# Patient Record
Sex: Male | Born: 1960 | Race: White | Hispanic: No | Marital: Single | Smoking: Never smoker
Health system: Southern US, Community
[De-identification: ages and names within clinical notes are randomized; demographics above are authoritative.]

## PROBLEM LIST (undated history)

## (undated) DIAGNOSIS — S42009A Fracture of unspecified part of unspecified clavicle, initial encounter for closed fracture: Secondary | ICD-10-CM

## (undated) DIAGNOSIS — M7500 Adhesive capsulitis of unspecified shoulder: Secondary | ICD-10-CM

## (undated) DIAGNOSIS — S42023A Displaced fracture of shaft of unspecified clavicle, initial encounter for closed fracture: Secondary | ICD-10-CM

## (undated) DIAGNOSIS — S42033A Displaced fracture of lateral end of unspecified clavicle, initial encounter for closed fracture: Secondary | ICD-10-CM

## (undated) DIAGNOSIS — C801 Malignant (primary) neoplasm, unspecified: Secondary | ICD-10-CM

## (undated) DIAGNOSIS — K219 Gastro-esophageal reflux disease without esophagitis: Secondary | ICD-10-CM

## (undated) DIAGNOSIS — I8390 Asymptomatic varicose veins of unspecified lower extremity: Secondary | ICD-10-CM

## (undated) DIAGNOSIS — S62609A Fracture of unspecified phalanx of unspecified finger, initial encounter for closed fracture: Secondary | ICD-10-CM

## (undated) DIAGNOSIS — S42009K Fracture of unspecified part of unspecified clavicle, subsequent encounter for fracture with nonunion: Secondary | ICD-10-CM

## (undated) DIAGNOSIS — M1990 Unspecified osteoarthritis, unspecified site: Secondary | ICD-10-CM

## (undated) DIAGNOSIS — S129XXA Fracture of neck, unspecified, initial encounter: Secondary | ICD-10-CM

## (undated) HISTORY — DX: Fracture of unspecified part of unspecified clavicle, subsequent encounter for fracture with nonunion: S42.009K

## (undated) HISTORY — DX: Fracture of unspecified phalanx of unspecified finger, initial encounter for closed fracture: S62.609A

## (undated) HISTORY — DX: Asymptomatic varicose veins of unspecified lower extremity: I83.90

## (undated) HISTORY — DX: Gastro-esophageal reflux disease without esophagitis: K21.9

## (undated) HISTORY — DX: Unspecified osteoarthritis, unspecified site: M19.90

## (undated) HISTORY — PX: PROSTATE SURGERY: SHX751

## (undated) HISTORY — DX: Adhesive capsulitis of unspecified shoulder: M75.00

## (undated) HISTORY — DX: Fracture of neck, unspecified, initial encounter: S12.9XXA

## (undated) HISTORY — DX: Displaced fracture of shaft of unspecified clavicle, initial encounter for closed fracture: S42.023A

## (undated) HISTORY — PX: TONSILLECTOMY: SUR1361

## (undated) HISTORY — DX: Fracture of unspecified part of unspecified clavicle, initial encounter for closed fracture: S42.009A

## (undated) HISTORY — DX: Displaced fracture of lateral end of unspecified clavicle, initial encounter for closed fracture: S42.033A

---

## 1979-07-13 HISTORY — PX: KNEE ARTHROSCOPY: SUR90

## 1988-11-11 HISTORY — PX: APPENDECTOMY: SHX54

## 2004-11-11 HISTORY — PX: SHOULDER ARTHROSCOPY: SHX128

## 2011-11-12 HISTORY — PX: FRACTURE SURGERY: SHX138

## 2011-12-30 DIAGNOSIS — S42033A Displaced fracture of lateral end of unspecified clavicle, initial encounter for closed fracture: Secondary | ICD-10-CM

## 2011-12-30 HISTORY — DX: Displaced fracture of lateral end of unspecified clavicle, initial encounter for closed fracture: S42.033A

## 2012-02-04 DIAGNOSIS — S42023A Displaced fracture of shaft of unspecified clavicle, initial encounter for closed fracture: Secondary | ICD-10-CM

## 2012-02-04 HISTORY — DX: Displaced fracture of shaft of unspecified clavicle, initial encounter for closed fracture: S42.023A

## 2012-05-18 DIAGNOSIS — S129XXA Fracture of neck, unspecified, initial encounter: Secondary | ICD-10-CM

## 2012-05-18 HISTORY — DX: Fracture of neck, unspecified, initial encounter: S12.9XXA

## 2012-07-15 DIAGNOSIS — M7500 Adhesive capsulitis of unspecified shoulder: Secondary | ICD-10-CM

## 2012-07-15 HISTORY — DX: Adhesive capsulitis of unspecified shoulder: M75.00

## 2013-03-15 DIAGNOSIS — S42009K Fracture of unspecified part of unspecified clavicle, subsequent encounter for fracture with nonunion: Secondary | ICD-10-CM

## 2013-03-15 HISTORY — DX: Fracture of unspecified part of unspecified clavicle, subsequent encounter for fracture with nonunion: S42.009K

## 2013-04-22 ENCOUNTER — Ambulatory Visit: Payer: Self-pay | Admitting: Gastroenterology

## 2013-04-22 LAB — HM COLONOSCOPY: HM Colonoscopy: 2

## 2013-04-23 LAB — PATHOLOGY REPORT

## 2014-05-12 LAB — BASIC METABOLIC PANEL
BUN: 17 mg/dL (ref 4–21)
Creatinine: 1 mg/dL (ref 0.6–1.3)
Glucose: 94 mg/dL
POTASSIUM: 4.7 mmol/L (ref 3.4–5.3)
SODIUM: 139 mmol/L (ref 137–147)

## 2014-05-12 LAB — LIPID PANEL
CHOLESTEROL: 216 mg/dL — AB (ref 0–200)
HDL: 56 mg/dL (ref 35–70)
LDL CALC: 137 mg/dL
LDL/HDL RATIO: 2.4
Triglycerides: 116 mg/dL (ref 40–160)

## 2014-05-12 LAB — HEPATIC FUNCTION PANEL
ALK PHOS: 59 U/L (ref 25–125)
ALT: 19 U/L (ref 10–40)
AST: 18 U/L (ref 14–40)
BILIRUBIN, TOTAL: 0.5 mg/dL

## 2014-05-12 LAB — CBC AND DIFFERENTIAL
HCT: 46 % (ref 41–53)
HEMOGLOBIN: 15.2 g/dL (ref 13.5–17.5)
NEUTROS ABS: 2 /uL
PLATELETS: 230 10*3/uL (ref 150–399)
WBC: 5.1 10*3/mL

## 2014-05-12 LAB — FECAL OCCULT BLOOD, GUAIAC: FECAL OCCULT BLD: NEGATIVE

## 2014-05-12 LAB — PSA: PSA: 3.9

## 2014-05-12 LAB — TSH: TSH: 1.22 u[IU]/mL (ref 0.41–5.90)

## 2015-03-23 DIAGNOSIS — M1990 Unspecified osteoarthritis, unspecified site: Secondary | ICD-10-CM | POA: Insufficient documentation

## 2015-03-23 DIAGNOSIS — I8390 Asymptomatic varicose veins of unspecified lower extremity: Secondary | ICD-10-CM

## 2015-03-23 DIAGNOSIS — K219 Gastro-esophageal reflux disease without esophagitis: Secondary | ICD-10-CM

## 2015-03-23 DIAGNOSIS — S62609A Fracture of unspecified phalanx of unspecified finger, initial encounter for closed fracture: Secondary | ICD-10-CM | POA: Insufficient documentation

## 2015-03-23 DIAGNOSIS — S42009A Fracture of unspecified part of unspecified clavicle, initial encounter for closed fracture: Secondary | ICD-10-CM

## 2015-03-23 DIAGNOSIS — N529 Male erectile dysfunction, unspecified: Secondary | ICD-10-CM | POA: Insufficient documentation

## 2015-03-23 HISTORY — DX: Fracture of unspecified phalanx of unspecified finger, initial encounter for closed fracture: S62.609A

## 2015-03-23 HISTORY — DX: Asymptomatic varicose veins of unspecified lower extremity: I83.90

## 2015-03-23 HISTORY — DX: Fracture of unspecified part of unspecified clavicle, initial encounter for closed fracture: S42.009A

## 2015-03-23 HISTORY — DX: Unspecified osteoarthritis, unspecified site: M19.90

## 2015-03-23 HISTORY — DX: Gastro-esophageal reflux disease without esophagitis: K21.9

## 2015-04-21 ENCOUNTER — Other Ambulatory Visit: Payer: Self-pay | Admitting: Family Medicine

## 2015-05-23 ENCOUNTER — Encounter: Payer: Self-pay | Admitting: Family Medicine

## 2015-05-23 ENCOUNTER — Ambulatory Visit (INDEPENDENT_AMBULATORY_CARE_PROVIDER_SITE_OTHER): Admitting: Family Medicine

## 2015-05-23 VITALS — BP 96/62 | HR 54 | Temp 97.5°F | Resp 16 | Ht 73.75 in | Wt 223.0 lb

## 2015-05-23 DIAGNOSIS — Z0000 Encounter for general adult medical examination without abnormal findings: Secondary | ICD-10-CM

## 2015-05-23 DIAGNOSIS — Z125 Encounter for screening for malignant neoplasm of prostate: Secondary | ICD-10-CM

## 2015-05-23 LAB — POCT URINALYSIS DIPSTICK
Bilirubin, UA: NEGATIVE
Blood, UA: NEGATIVE
GLUCOSE UA: NEGATIVE
KETONES UA: NEGATIVE
Leukocytes, UA: NEGATIVE
Nitrite, UA: NEGATIVE
Protein, UA: NEGATIVE
SPEC GRAV UA: 1.01
Urobilinogen, UA: 0.2
pH, UA: 6.5

## 2015-05-23 LAB — IFOBT (OCCULT BLOOD): IMMUNOLOGICAL FECAL OCCULT BLOOD TEST: NEGATIVE

## 2015-05-23 NOTE — Progress Notes (Signed)
Patient ID: Travelle Mcclimans, male   DOB: 1961/10/12, 54 y.o.   MRN: [id number]       Patient: Karlos Scadden, Male    DOB: May 11, 1961, 54 y.o.   MRN: [id number] Visit Date: 05/23/2015  Today's Provider: Wilhemena Durie, MD   Chief Complaint  Patient presents with  . Annual Exam   Subjective:    Annual physical exam Waymond Meador is a 54 y.o. male who presents today for health maintenance and complete physical. He feels well. He reports exercising. He reports he is sleeping fairly well.  -----------------------------------------------------------------   Review of Systems  Constitutional: Negative.   HENT: Negative.   Eyes: Negative.   Respiratory: Negative.   Cardiovascular: Negative.   Gastrointestinal: Negative.   Endocrine: Negative.   Genitourinary: Negative.        Very infrequent anal ache/discomfort which is most consistent with some mild prostatism.  Musculoskeletal: Negative.   Skin: Negative.        Recent bug bite of some sort below the left groin which was tender and pruritic. It is much improved. The patient also spent a small tick in his left lower buttocks with couple of weeks ago. It was found here in [place]  Allergic/Immunologic: Negative.   Neurological: Negative.   Hematological: Negative.   All other systems reviewed and are negative.   Social History He  reports that he has never smoked. His smokeless tobacco use includes Snuff. He reports that he drinks alcohol. He reports that he does not use illicit drugs.  Patient Active Problem List   Diagnosis Date Noted  . Failure of erection 03/23/2015  . Fracture of clavicle 03/23/2015  . Broken finger 03/23/2015  . Acid reflux 03/23/2015  . Arthritis, degenerative 03/23/2015  . 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 Tonsillectomy  1960's    as a child  . Appendectomy  1990  . Fracture surgery Right 2013    clavicle  . Knee arthroscopy  Right 1980's  . Shoulder arthroscopy Right 2006    Family History His family history includes Cancer in his mother; Diabetes in his father.    No Known Allergies  Previous Medications   MISC NATURAL PRODUCTS (GLUCOSAMINE CHONDROITIN COMPLX) TABS    Take by mouth.   MULTIPLE VITAMIN PO    Take by mouth.   NUTRITIONAL SUPPLEMENTS PO    Take by mouth.   OMEGA-3 FATTY ACIDS (FISH OIL BURP-LESS) 500 MG CAPS    Take by mouth.   OMEPRAZOLE 20 MG TBEC    Take by mouth.   SILDENAFIL (VIAGRA) 100 MG TABLET    Take by mouth.    Patient Care Team: Jerrol Banana., MD as PCP - General (Family Medicine)     Objective:   Vitals: BP 96/62 mmHg  Pulse 54  Temp(Src) 97.5 F (36.4 C) (Oral)  Resp 16  Ht 6' 1.75" (1.873 m)  Wt 223 lb (101.152 kg)  BMI 28.83 kg/m2   Physical Exam  Constitutional: He is oriented to person, place, and time. He appears well-developed and well-nourished.  HENT:  Head: Normocephalic and atraumatic.  Right Ear: External ear normal.  Left Ear: External ear normal.  Mouth/Throat: Oropharynx is clear and moist.  Eyes: Conjunctivae and EOM are normal. Pupils are equal, round, and reactive to light.  Neck: Normal range of motion. Neck supple.  Cardiovascular: Normal rate, regular rhythm, normal heart sounds and intact distal pulses.  Small/moderate size varicose veins in both lower extremities right slightly greater than left.  Pulmonary/Chest: Effort normal and breath sounds normal.  Abdominal: Soft. Bowel sounds are normal.  Genitourinary: Rectum normal, prostate normal and penis normal.  Mildly enlarged prostate without nodule. Slightly fluctuant.  Musculoskeletal: Normal range of motion.  Lymphadenopathy:  Single small lymph node that is nontender and mobile in the left inguinal area.  Neurological: He is alert and oriented to person, place, and time. He has normal reflexes.  Skin: Skin is warm and dry.  Psychiatric: He has a normal mood and affect.  His behavior is normal. Judgment and thought content normal.     Depression Screen No flowsheet data found.    Assessment & Plan:     Routine Health Maintenance and Physical Exam  Exercise Activities and Dietary recommendations Goals    None      Immunization History  Administered Date(s) Administered  . Tdap 05/12/2014    Health Maintenance  Topic Date Due  . HIV Screening  12/14/1974  . INFLUENZA VACCINE  06/12/2015  . COLONOSCOPY  04/23/2023  . TETANUS/TDAP  05/12/2024      Discussed health benefits of physical activity, and encouraged him to engage in regular exercise appropriate for his age and condition.   Recent tick bite/but bites Resolving spontaneously. Mildly symptomatic varicose veins Prostatism without urinary symptoms Return to clinic 1 year --------------------------------------------------------------------

## 2015-05-24 LAB — TSH: TSH: 1.17 u[IU]/mL (ref 0.450–4.500)

## 2015-05-24 LAB — LIPID PANEL WITH LDL/HDL RATIO
CHOLESTEROL TOTAL: 212 mg/dL — AB (ref 100–199)
HDL: 58 mg/dL (ref >39)
LDL CALC: 129 mg/dL — AB (ref 0–99)
LDl/HDL Ratio: 2.2 ratio units (ref 0.0–3.6)
Triglycerides: 124 mg/dL (ref 0–149)
VLDL Cholesterol Cal: 25 mg/dL (ref 5–40)

## 2015-05-24 LAB — CBC WITH DIFFERENTIAL/PLATELET
BASOS: 0 %
Basophils Absolute: 0 10*3/uL (ref 0.0–0.2)
EOS (ABSOLUTE): 0.2 10*3/uL (ref 0.0–0.4)
Eos: 3 %
Hematocrit: 45 % (ref 37.5–51.0)
Hemoglobin: 15.3 g/dL (ref 12.6–17.7)
Immature Grans (Abs): 0 10*3/uL (ref 0.0–0.1)
Immature Granulocytes: 0 %
LYMPHS: 26 %
Lymphocytes Absolute: 1.5 10*3/uL (ref 0.7–3.1)
MCH: 32 pg (ref 26.6–33.0)
MCHC: 34 g/dL (ref 31.5–35.7)
MCV: 94 fL (ref 79–97)
MONOCYTES: 10 %
Monocytes Absolute: 0.6 10*3/uL (ref 0.1–0.9)
Neutrophils Absolute: 3.5 10*3/uL (ref 1.4–7.0)
Neutrophils: 61 %
Platelets: 244 10*3/uL (ref 150–379)
RBC: 4.78 x10E6/uL (ref 4.14–5.80)
RDW: 12.8 % (ref 12.3–15.4)
WBC: 5.8 10*3/uL (ref 3.4–10.8)

## 2015-05-24 LAB — COMPREHENSIVE METABOLIC PANEL
ALK PHOS: 60 IU/L (ref 39–117)
ALT: 20 IU/L (ref 0–44)
AST: 17 IU/L (ref 0–40)
Albumin/Globulin Ratio: 1.5 (ref 1.1–2.5)
Albumin: 4.4 g/dL (ref 3.5–5.5)
BUN / CREAT RATIO: 18 (ref 9–20)
BUN: 15 mg/dL (ref 6–24)
Bilirubin Total: 0.5 mg/dL (ref 0.0–1.2)
CO2: 24 mmol/L (ref 18–29)
CREATININE: 0.85 mg/dL (ref 0.76–1.27)
Calcium: 9.6 mg/dL (ref 8.7–10.2)
Chloride: 100 mmol/L (ref 97–108)
GFR calc Af Amer: 113 mL/min/{1.73_m2} (ref >59)
GFR calc non Af Amer: 98 mL/min/{1.73_m2} (ref >59)
GLOBULIN, TOTAL: 2.9 g/dL (ref 1.5–4.5)
GLUCOSE: 94 mg/dL (ref 65–99)
Potassium: 4.7 mmol/L (ref 3.5–5.2)
Sodium: 138 mmol/L (ref 134–144)
Total Protein: 7.3 g/dL (ref 6.0–8.5)

## 2015-05-24 LAB — PSA: PROSTATE SPECIFIC AG, SERUM: 4.5 ng/mL — AB (ref 0.0–4.0)

## 2015-05-29 ENCOUNTER — Telehealth: Payer: Self-pay

## 2015-05-29 DIAGNOSIS — N410 Acute prostatitis: Secondary | ICD-10-CM

## 2015-05-29 MED ORDER — SULFAMETHOXAZOLE-TRIMETHOPRIM 800-160 MG PO TABS: 1.0000 | ORAL_TABLET | Freq: Two times a day (BID) | ORAL | Status: DC

## 2015-05-29 NOTE — Telephone Encounter (Signed)
-----   Message from Jerrol Banana., MD sent at 05/26/2015 11:20 AM EDT ----- Labs okay but he has a mildly elevated for his age. Can treat as prostatitis and recheck PSA for normalization in 1 month or can refer to urology now. Either way is acceptable.

## 2015-05-29 NOTE — Telephone Encounter (Signed)
Pt advised, pt will go ahead and go with treating prostatitis and re checking it in 1 month, medication sent in and lab slip placed up front-aa

## 2015-06-30 LAB — PSA: PROSTATE SPECIFIC AG, SERUM: 4.7 ng/mL — AB (ref 0.0–4.0)

## 2015-07-03 ENCOUNTER — Other Ambulatory Visit: Payer: Self-pay | Admitting: Emergency Medicine

## 2015-07-03 DIAGNOSIS — R972 Elevated prostate specific antigen [PSA]: Secondary | ICD-10-CM

## 2015-07-13 DIAGNOSIS — C61 Malignant neoplasm of prostate: Secondary | ICD-10-CM | POA: Insufficient documentation

## 2015-07-20 ENCOUNTER — Ambulatory Visit: Admitting: Urology

## 2015-08-21 ENCOUNTER — Ambulatory Visit (INDEPENDENT_AMBULATORY_CARE_PROVIDER_SITE_OTHER): Admitting: Family Medicine

## 2015-08-21 ENCOUNTER — Encounter: Payer: Self-pay | Admitting: Family Medicine

## 2015-08-21 VITALS — BP 98/70 | HR 62 | Temp 98.5°F | Resp 12 | Wt 228.0 lb

## 2015-08-21 DIAGNOSIS — Z01818 Encounter for other preprocedural examination: Secondary | ICD-10-CM

## 2015-08-21 DIAGNOSIS — Z01810 Encounter for preprocedural cardiovascular examination: Secondary | ICD-10-CM

## 2015-08-21 NOTE — Progress Notes (Signed)
Patient ID: Frank Peterson, male   DOB: 05/12/1961, 54 y.o.   MRN: [id number]    Subjective:  HPI  Patient is having prostate procedure- HIFU- this has been scheduled for November the 4th. Dr. Ardelle Park is doing the procedure. Patient states this has been caought early so he did not want to do prostatectomy at this time. He is doing well. He had labs done through urologist on August 31st. Urologist office needs a clearance from [place] for this procedure.   Prior to Admission medications   Medication Sig Start Date End Date Taking? Authorizing Provider  Misc Natural Products (GLUCOSAMINE CHONDROITIN COMPLX) TABS Take by mouth.   Yes Historical Provider, MD  MULTIPLE VITAMIN PO Take by mouth.   Yes Historical Provider, MD  NUTRITIONAL SUPPLEMENTS PO Take by mouth.   Yes Historical Provider, MD  Omega-3 Fatty Acids (FISH OIL BURP-LESS) 500 MG CAPS Take by mouth.   Yes Historical Provider, MD  Omeprazole 20 MG TBEC Take by mouth. 03/29/14  Yes Historical Provider, MD  sildenafil (VIAGRA) 100 MG tablet Take by mouth. 08/13/13  Yes Historical Provider, MD  sulfamethoxazole-trimethoprim (BACTRIM DS,SEPTRA DS) 800-160 MG per tablet Take 1 tablet by mouth 2 (two) times daily. 05/29/15  Yes Richard Maceo Pro., MD    Patient Active Problem List   Diagnosis Date Noted  . Failure of erection 03/23/2015  . Fracture of clavicle 03/23/2015  . Broken finger 03/23/2015  . Acid reflux 03/23/2015  . Arthritis, degenerative 03/23/2015  . 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  Social History   Social History  . Marital Status: Single    Spouse Name: N/A  . Number of Children: N/A  . Years of Education: N/A   Occupational History  . Not on file.   Social History Main Topics  . Smoking status: Never Smoker   . Smokeless tobacco: Current User    Types: Snuff  . Alcohol Use: Yes     Comment: 2-3 drinks 5 days a week, drinks beer  . Drug Use: No  . Sexual  Activity: Not on file   Other Topics Concern  . Not on file   Social History Narrative    No Known Allergies  Review of Systems  Constitutional: Negative.   Respiratory: Negative.   Cardiovascular: Negative.   Gastrointestinal: Negative.   Genitourinary: Negative.   Skin: Negative.   Neurological: Negative.   Psychiatric/Behavioral: Negative.     Immunization History  Administered Date(s) Administered  . Influenza-Unspecified 07/18/2015  . Tdap 05/12/2014   Objective:  BP 98/70 mmHg  Pulse 62  Temp(Src) 98.5 F (36.9 C)  Resp 12  Wt 228 lb (103.42 kg)  Physical Exam  Constitutional: He is oriented to person, place, and time and well-developed, well-nourished, and in no distress.  HENT:  Head: Normocephalic and atraumatic.  Right Ear: External ear normal.  Left Ear: External ear normal.  Nose: Nose normal.  Eyes: Conjunctivae are normal.  Neck: Neck supple.  Cardiovascular: Normal rate, regular rhythm and normal heart sounds.   Pulmonary/Chest: Effort normal and breath sounds normal.  Abdominal: Soft.  Neurological: He is alert and oriented to person, place, and time. Gait normal.  Skin: Skin is warm and dry.  Psychiatric: Mood, memory, affect and judgment normal.    Lab Results  Component Value Date   WBC 5.8 05/23/2015   HGB 15.2 05/12/2014   HCT 45.0 05/23/2015   PLT 230 05/12/2014   GLUCOSE  94 05/23/2015   CHOL 212* 05/23/2015   TRIG 124 05/23/2015   HDL 58 05/23/2015   LDLCALC 129* 05/23/2015   TSH 1.170 05/23/2015   PSA 4.7* 06/29/2015    CMP     Component Value Date/Time   NA 138 05/23/2015 1004   K 4.7 05/23/2015 1004   CL 100 05/23/2015 1004   CO2 24 05/23/2015 1004   GLUCOSE 94 05/23/2015 1004   BUN 15 05/23/2015 1004   CREATININE 0.85 05/23/2015 1004   CREATININE 1.0 05/12/2014   CALCIUM 9.6 05/23/2015 1004   PROT 7.3 05/23/2015 1004   ALBUMIN 4.4 05/23/2015 1004   AST 17 05/23/2015 1004   ALT 20 05/23/2015 1004   ALKPHOS 60  05/23/2015 1004   BILITOT 0.5 05/23/2015 1004   GFRNONAA 98 05/23/2015 1004   GFRAA 113 05/23/2015 1004    Assessment and Plan :  Prostate cancer Patient medically cleared for surgery. He is planning to have HIFU. He is planning to seek out second opinion about this.  Miguel Aschoff MD [facility] 08/21/2015 5:05 PM

## 2015-08-23 ENCOUNTER — Other Ambulatory Visit: Payer: Self-pay

## 2015-08-23 MED ORDER — OMEPRAZOLE 20 MG PO TBEC: 1.0000 | DELAYED_RELEASE_TABLET | Freq: Every day | ORAL | Status: DC

## 2015-08-30 ENCOUNTER — Ambulatory Visit (INDEPENDENT_AMBULATORY_CARE_PROVIDER_SITE_OTHER): Admitting: Urology

## 2015-08-30 ENCOUNTER — Encounter: Payer: Self-pay | Admitting: Urology

## 2015-08-30 VITALS — BP 100/66 | HR 61 | Ht 72.0 in | Wt 225.9 lb

## 2015-08-30 DIAGNOSIS — Z8546 Personal history of malignant neoplasm of prostate: Secondary | ICD-10-CM | POA: Diagnosis not present

## 2015-08-30 NOTE — Progress Notes (Signed)
08/30/2015 4:56 PM   Larina Bras [birth year] [id number]  Referring provider: Jerrol Banana., MD [address] [place], [place] [address]  Chief Complaint  Patient presents with  . Establish Care    2nd opinion prostate cancer     HPI: Patient had diagnosis September 23 adenocarcinoma prostate by biopsy. In the apical region on the right. All other biopsies 12 quadrant biopsy process were negative. He had an ultrasonic prostate biopsy. I did not see on his reports the size of his prostate but I did see the PSA of 4.39. Patient has no family history of prostate cancer. His prostate cancer biopsy was done as a result of an elevated PSA of 4.3. He sexually active is very active with sports and activity. He doesn't he wants minimal quality of life problems with any procedure for his prostate cancer. See note assessment and plan for my recommendations patient patient is not married. So I saw him alone without family.   PMH: Past Medical History  Diagnosis Date  . 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 Acid reflux 03/23/2015  . Fracture of clavicle 03/23/2015  . Broken finger 03/23/2015  . Arthritis, degenerative 03/23/2015  . Adhesive capsulitis 07/15/2012  . Clavicular fracture, closed, acromial end 12/30/2011  . Clavicular fracture, closed, shaft 02/04/2012  . 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 Tonsillectomy  1960's    as a child  . Appendectomy  1990  . Fracture surgery Right 2013    clavicle  . Knee arthroscopy Right 1980's  . Shoulder arthroscopy Right 2006    Home Medications:    Medication List       This list is accurate as of: 08/30/15  4:56 PM.  Always use your most recent med list.               finasteride 5 MG tablet  Commonly known as:  PROSCAR     FISH OIL BURP-LESS 500 MG Caps  Take by mouth.     FLUARIX  QUADRIVALENT 0.5 ML injection  Generic drug:  Influenza vac split quadrivalent PF     GLUCOSAMINE CHONDROITIN COMPLX Tabs  Take by mouth.     MULTIPLE VITAMIN PO  Take by mouth.     NUTRITIONAL SUPPLEMENTS PO  Take by mouth.     omeprazole 20 MG capsule  Commonly known as:  PRILOSEC     sildenafil 100 MG tablet  Commonly known as:  VIAGRA  Take by mouth.     sulfamethoxazole-trimethoprim 800-160 MG tablet  Commonly known as:  BACTRIM DS,SEPTRA DS  Take 1 tablet by mouth 2 (two) times daily.     tamsulosin 0.4 MG Caps capsule  Commonly known as:  FLOMAX        Allergies: No Known Allergies  Family History: Family History  Problem Relation Age of Onset  . Cancer Mother     Thyriod and breast cancer  . Diabetes Father     Social History:  reports that he has never smoked. His smokeless tobacco use includes Chew. He reports that he drinks alcohol. He reports that he does not use illicit drugs.  ROS: UROLOGY Frequent Urination?: Yes Hard to postpone urination?: No Burning/pain with urination?: No Get up at night to urinate?: Yes Leakage of urine?: No Urine stream starts and stops?: No Trouble starting stream?: No Do you have  to strain to urinate?: No Blood in urine?: No Urinary tract infection?: No Sexually transmitted disease?: No Injury to kidneys or bladder?: No Painful intercourse?: No Weak stream?: No Erection problems?: No Penile pain?: No  Gastrointestinal Nausea?: No Vomiting?: No Indigestion/heartburn?: No Diarrhea?: No Constipation?: No  Constitutional Fever: No Night sweats?: No Weight loss?: No Fatigue?: No  Skin Skin rash/lesions?: No Itching?: No  Eyes Blurred vision?: No Double vision?: No  Ears/Nose/Throat Sore throat?: No Sinus problems?: No  Hematologic/Lymphatic Swollen glands?: No Easy bruising?: No  Cardiovascular Leg swelling?: No Chest pain?: No  Respiratory Cough?: No Shortness of breath?:  No  Endocrine Excessive thirst?: No  Musculoskeletal Back pain?: No Joint pain?: No  Neurological Headaches?: No Dizziness?: No  Psychologic Depression?: No  Physical Exam: BP 100/66 mmHg  Pulse 61  Ht 6' (1.829 m)  Wt 225 lb 14.4 oz (102.468 kg)  BMI 30.63 kg/m2  Constitutional:  Alert and oriented, No acute distress. HEENT: [place] AT, moist mucus membranes.  Trachea midline, no masses. Cardiovascular: No clubbing, cyanosis, or edema. Respiratory: Normal respiratory effort, no increased work of breathing. GI: Abdomen is soft, nontender, nondistended, no abdominal masses GU: No CVA tenderness. Normal penis and testes Skin: No rashes, bruises or suspicious lesions. Lymph: No cervical or inguinal adenopathy. Neurologic: Grossly intact, no focal deficits, moving all 4 extremities. Psychiatric: Normal mood and affect.  Laboratory Data: Lab Results  Component Value Date   WBC 5.8 05/23/2015   HGB 15.2 05/12/2014   HCT 45.0 05/23/2015   PLT 230 05/12/2014    Lab Results  Component Value Date   CREATININE 0.85 05/23/2015    Lab Results  Component Value Date   PSA 4.7* 06/29/2015   PSA 4.5* 05/23/2015   PSA 3.9 05/12/2014    No results found for: TESTOSTERONE  No results found for: HGBA1C  Urinalysis    Component Value Date/Time   BILIRUBINUR neg 05/23/2015 0958   PROTEINUR neg 05/23/2015 0958   UROBILINOGEN 0.2 05/23/2015 0958   NITRITE neg 05/23/2015 0958   LEUKOCYTESUR Negative 05/23/2015 0958    Pertinent Imaging: None today  Assessment & Plan:  Adenocarcinoma prostate grade T1c PSA 4.39 did not have a report as to the size prostate. Grade Gleason 3+3 positive at the right apex told the patient any procedure he has done at this point life at age 54  Any  procedure whether it's watchful waiting H IFU or Sonablate, radical robotic prostatectomy, seed implant in the lateral cryotherapy. Would be acceptable  Patient is very concerned about his sexual ability  so HIFU would be the first choice along with watchful waiting. At age 2 robotics would be a choice. Patient is very concerned about the side effects of any procedure done. he is going for a second opinion for robotics opinion. I described the surgery and side effects of all procedures which took about 45 minutes of discussion. These included the relative percentages of impotence and incontinence stricture disease cystitis proctitis urethral fistulous vesicle fistulous bleeding and chronic outlet obstruction from scarring. I discussed each of these complications in regards to each of the surgeries or procedures that could be done to this patient  There are no diagnoses linked to this encounter.  No Follow-up on file.  Collier Flowers, [facility] [address] [place], [place] [address] [phone]

## 2016-04-16 ENCOUNTER — Ambulatory Visit (INDEPENDENT_AMBULATORY_CARE_PROVIDER_SITE_OTHER): Admitting: Family Medicine

## 2016-04-16 VITALS — BP 98/60 | HR 52 | Temp 97.6°F | Resp 16 | Wt 229.0 lb

## 2016-04-16 DIAGNOSIS — B029 Zoster without complications: Secondary | ICD-10-CM

## 2016-04-16 MED ORDER — VALACYCLOVIR HCL 1 G PO TABS: 1000.0000 mg | ORAL_TABLET | Freq: Three times a day (TID) | ORAL | Status: DC

## 2016-04-16 MED ORDER — DOXYCYCLINE HYCLATE 100 MG PO TABS: 100.0000 mg | ORAL_TABLET | Freq: Two times a day (BID) | ORAL | Status: DC

## 2016-04-16 NOTE — Progress Notes (Signed)
Patient ID: Frank Peterson, male   DOB: 03-29-1961, 55 y.o.   MRN: [id number]   Frank Peterson  MRN: [id number] DOB: 1961-08-06  Subjective:  HPI   The patient is a 55 year old male who presents with complaint of a lesion on his scalp that has pain.  He thinks it is an insect bite but there is two places.  He states he first noticed it about 4-5 days ago.  It got bigger and did have some draining.  He states that today it is not quite and painful.  Patient Active Problem List   Diagnosis Date Noted  . Failure of erection 03/23/2015  . Fracture of clavicle 03/23/2015  . Broken finger 03/23/2015  . Acid reflux 03/23/2015  . Arthritis, degenerative 03/23/2015  . 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 Adhesive capsulitis 07/15/2012  . 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 Clavicular fracture, closed, shaft 02/04/2012  . Clavicular fracture, closed, acromial end 12/30/2011    Past Medical History  Diagnosis Date  . 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 Acid reflux 03/23/2015  . Fracture of clavicle 03/23/2015  . Broken finger 03/23/2015  . Arthritis, degenerative 03/23/2015  . Adhesive capsulitis 07/15/2012  . Clavicular fracture, closed, acromial end 12/30/2011  . Clavicular fracture, closed, shaft 02/04/2012  . 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 Marital Status: Single    Spouse Name: N/A  . Number of Children: N/A  . Years of Education: N/A   Occupational History  . Not on file.   Social History Main Topics  . Smoking status: Never Smoker   . Smokeless tobacco: Current User    Types: Chew  . Alcohol Use: 0.0 oz/week    0 Standard drinks or equivalent per week     Comment: 2-3 drinks 5 days a week, drinks beer  . Drug Use: No  . Sexual Activity:    Partners: Male   Other Topics Concern  . Not on file   Social History  Narrative    Outpatient Prescriptions Prior to Visit  Medication Sig Dispense Refill  . finasteride (PROSCAR) 5 MG tablet     . Misc Natural Products (GLUCOSAMINE CHONDROITIN COMPLX) TABS Take by mouth.    . MULTIPLE VITAMIN PO Take by mouth.    . NUTRITIONAL SUPPLEMENTS PO Take by mouth.    . Omega-3 Fatty Acids (FISH OIL BURP-LESS) 500 MG CAPS Take by mouth.    Marland Kitchen omeprazole (PRILOSEC) 20 MG capsule     . sildenafil (VIAGRA) 100 MG tablet Take by mouth.    Marland Kitchen FLUARIX QUADRIVALENT 0.5 ML injection     . sulfamethoxazole-trimethoprim (BACTRIM DS,SEPTRA DS) 800-160 MG per tablet Take 1 tablet by mouth 2 (two) times daily. (Patient not taking: Reported on 08/30/2015) 20 tablet 0  . tamsulosin (FLOMAX) 0.4 MG CAPS capsule      No facility-administered medications prior to visit.    No Known Allergies  Review of Systems  Constitutional: Negative for fever and malaise/fatigue.  HENT: Positive for ear pain.        Swelling and heat in the area of the lesion.  Deep pain/stinging/burning  Respiratory: Negative for cough, shortness of breath and wheezing.   Cardiovascular: Negative for chest pain, palpitations, orthopnea, claudication, leg swelling and PND.  Neurological: Positive for headaches. Negative for dizziness and weakness.  Objective:  BP 98/60 mmHg  Pulse 52  Temp(Src) 97.6 F (36.4 C) (Oral)  Resp 16  Wt 229 lb (103.874 kg)  Physical Exam  Constitutional: He is well-developed, well-nourished, and in no distress.  HENT:  Head: Normocephalic and atraumatic.  Right Ear: External ear normal.  Left Ear: External ear normal.  Nose: Nose normal.  Skin: Rash noted. There is erythema.  Patient appears to be developing a vesicular rash in the left occiput with 2 lesions. One is about the size of a quarter near the parietal area and one in the size of a dime more posterior.    Assessment and Plan :  No diagnosis found. 1. Shingles We'll give patient coverage for secondary  infection in case he gets worse over the weekend. Think this is shingles. - valACYclovir (VALTREX) 1000 MG tablet; Take 1 tablet (1,000 mg total) by mouth 3 (three) times daily.  Dispense: 21 tablet; Refill: 0 - doxycycline (VIBRA-TABS) 100 MG tablet; Take 1 tablet (100 mg total) by mouth 2 (two) times daily.  Dispense: 14 tablet; Refill: [place] MD [facility] 04/16/2016 4:03 PM

## 2016-05-23 ENCOUNTER — Ambulatory Visit (INDEPENDENT_AMBULATORY_CARE_PROVIDER_SITE_OTHER): Admitting: Family Medicine

## 2016-05-23 ENCOUNTER — Encounter: Payer: Self-pay | Admitting: Family Medicine

## 2016-05-23 VITALS — BP 104/62 | HR 60 | Temp 97.9°F | Resp 12 | Ht 72.75 in | Wt 229.0 lb

## 2016-05-23 DIAGNOSIS — C61 Malignant neoplasm of prostate: Secondary | ICD-10-CM | POA: Diagnosis not present

## 2016-05-23 DIAGNOSIS — Z1211 Encounter for screening for malignant neoplasm of colon: Secondary | ICD-10-CM | POA: Diagnosis not present

## 2016-05-23 DIAGNOSIS — Z0000 Encounter for general adult medical examination without abnormal findings: Secondary | ICD-10-CM

## 2016-05-23 LAB — POCT URINALYSIS DIPSTICK
Bilirubin, UA: NEGATIVE
GLUCOSE UA: NEGATIVE
Ketones, UA: NEGATIVE
Leukocytes, UA: NEGATIVE
NITRITE UA: NEGATIVE
PH UA: 6
Protein, UA: NEGATIVE
RBC UA: NEGATIVE
SPEC GRAV UA: 1.015
UROBILINOGEN UA: 0.2

## 2016-05-23 NOTE — Progress Notes (Signed)
Patient: Frank Peterson, Male    DOB: 02/16/1961, 56 y.o.   MRN: [id number] Visit Date: 05/23/2016  Today's Provider: Wilhemena Durie, MD   Chief Complaint  Patient presents with  . Annual Exam   Subjective:  Frank Peterson is a 55 y.o. male who presents today for health maintenance and complete physical. He feels well. He reports exercising 3 to 4 days a week strenuous exercises and walking. He reports he is sleeping well.  Immunization History  Administered Date(s) Administered  . Influenza-Unspecified 07/18/2015  . Tdap 05/12/2014   Last colonoscopy was 04/22/13 2 polyps otherwise normal exam.  Review of Systems  Constitutional: Negative.   HENT: Negative.   Eyes: Negative.   Respiratory: Negative.   Cardiovascular: Negative.   Gastrointestinal: Negative.   Endocrine: Negative.   Genitourinary: Negative.   Musculoskeletal: Negative.   Skin: Negative.   Allergic/Immunologic: Negative.   Neurological: Negative.   Hematological: Negative.   Psychiatric/Behavioral: Negative.     Social History   Social History  . Marital Status: Single    Spouse Name: N/A  . Number of Children: N/A  . Years of Education: N/A   Occupational History  . Not on file.   Social History Main Topics  . Smoking status: Never Smoker   . Smokeless tobacco: Current User    Types: Chew  . Alcohol Use: 0.0 oz/week    0 Standard drinks or equivalent per week     Comment: 2-3 drinks 5 days a week, drinks beer  . Drug Use: No  . Sexual Activity:    Partners: Male   Other Topics Concern  . Not on file   Social History Narrative    Patient Active Problem List   Diagnosis Date Noted  . Prostate cancer ([place]) 05/23/2016  . Failure of erection 03/23/2015  . Fracture of clavicle 03/23/2015  . Broken finger 03/23/2015  . Acid reflux 03/23/2015  . Arthritis, degenerative 03/23/2015  . 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 Adhesive  capsulitis 07/15/2012  . 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 Clavicular fracture, closed, shaft 02/04/2012  . Clavicular fracture, closed, acromial end 12/30/2011    Past Surgical History  Procedure Laterality Date  . Tonsillectomy  1960's    as a child  . Appendectomy  1990  . Fracture surgery Right 2013    clavicle  . Knee arthroscopy Right 1980's  . Shoulder arthroscopy Right 2006  . Prostate surgery      HIFU-08/2015    His family history includes Cancer in his mother; Diabetes in his father.    Outpatient Prescriptions Prior to Visit  Medication Sig Dispense Refill  . finasteride (PROSCAR) 5 MG tablet     . Misc Natural Products (GLUCOSAMINE CHONDROITIN COMPLX) TABS Take by mouth.    . MULTIPLE VITAMIN PO Take by mouth.    . NUTRITIONAL SUPPLEMENTS PO Take by mouth.    . Omega-3 Fatty Acids (FISH OIL BURP-LESS) 500 MG CAPS Take by mouth.    Marland Kitchen omeprazole (PRILOSEC) 20 MG capsule     . sildenafil (VIAGRA) 100 MG tablet Take by mouth.    . doxycycline (VIBRA-TABS) 100 MG tablet Take 1 tablet (100 mg total) by mouth 2 (two) times daily. 14 tablet 0  . valACYclovir (VALTREX) 1000 MG tablet Take 1 tablet (1,000 mg total) by mouth 3 (three) times daily. 21 tablet 0   No facility-administered medications prior to visit.  Patient Care Team: Jerrol Banana., MD as PCP - General (Family Medicine)     Objective:   Vitals:  Filed Vitals:   05/23/16 0907  BP: 104/62  Pulse: 60  Temp: 97.9 F (36.6 C)  Resp: 12  Height: 6' 0.75" (1.848 m)  Weight: 229 lb (103.874 kg)    Physical Exam  Constitutional: He is oriented to person, place, and time. He appears well-developed and well-nourished.  HENT:  Head: Normocephalic and atraumatic.  Right Ear: External ear normal.  Left Ear: External ear normal.  Mouth/Throat: Oropharynx is clear and moist.  Eyes: Conjunctivae are normal. Pupils are equal, round, and reactive to light.  Neck: Normal range  of motion. Neck supple.  Cardiovascular: Normal rate, regular rhythm, normal heart sounds and intact distal pulses.   No murmur heard. Pulmonary/Chest: Effort normal and breath sounds normal. No respiratory distress. He has no wheezes.  Abdominal: Soft. He exhibits no distension. There is no tenderness.  Genitourinary: Penis normal. No penile tenderness.  Musculoskeletal: Normal range of motion. He exhibits no edema or tenderness.  Neurological: He is alert and oriented to person, place, and time. No cranial nerve deficit.  Skin: Skin is warm and dry. No rash noted. No erythema.  Psychiatric: He has a normal mood and affect. His behavior is normal. Judgment and thought content normal.     Depression Screen PHQ 2/9 Scores 05/23/2016 08/21/2015  PHQ - 2 Score 0 0      Assessment & Plan:   1. Annual physical exam  - CBC with Differential/Platelet - Comprehensive metabolic panel - Lipid Panel With LDL/HDL Ratio - POCT Urinalysis Dipstick - TSH  2. Colon cancer screening Defer DRE today.  3. Prostate cancer ([place]) Had HIFU prostate procedure done October 2016. Following urologist. 4. Prostate cancer Followed by Dr. Yves Dill from urology Patient was seen and examined by Dr. Eulas Post and note was scribed by Theressa Millard, [place].

## 2016-05-24 LAB — LIPID PANEL WITH LDL/HDL RATIO
CHOLESTEROL TOTAL: 210 mg/dL — AB (ref 100–199)
HDL: 52 mg/dL (ref >39)
LDL CALC: 119 mg/dL — AB (ref 0–99)
LDL/HDL RATIO: 2.3 ratio (ref 0.0–3.6)
Triglycerides: 197 mg/dL — ABNORMAL HIGH (ref 0–149)
VLDL CHOLESTEROL CAL: 39 mg/dL (ref 5–40)

## 2016-05-24 LAB — COMPREHENSIVE METABOLIC PANEL
ALK PHOS: 61 IU/L (ref 39–117)
ALT: 20 IU/L (ref 0–44)
AST: 25 IU/L (ref 0–40)
Albumin/Globulin Ratio: 1.6 (ref 1.2–2.2)
Albumin: 4.4 g/dL (ref 3.5–5.5)
BUN/Creatinine Ratio: 17 (ref 9–20)
BUN: 16 mg/dL (ref 6–24)
Bilirubin Total: 0.5 mg/dL (ref 0.0–1.2)
CO2: 23 mmol/L (ref 18–29)
CREATININE: 0.93 mg/dL (ref 0.76–1.27)
Calcium: 9.3 mg/dL (ref 8.7–10.2)
Chloride: 100 mmol/L (ref 96–106)
GFR calc Af Amer: 106 mL/min/{1.73_m2} (ref >59)
GFR, EST NON AFRICAN AMERICAN: 91 mL/min/{1.73_m2} (ref >59)
Globulin, Total: 2.8 g/dL (ref 1.5–4.5)
Glucose: 93 mg/dL (ref 65–99)
Potassium: 4.3 mmol/L (ref 3.5–5.2)
Sodium: 139 mmol/L (ref 134–144)
TOTAL PROTEIN: 7.2 g/dL (ref 6.0–8.5)

## 2016-05-24 LAB — CBC WITH DIFFERENTIAL/PLATELET
Basophils Absolute: 0 10*3/uL (ref 0.0–0.2)
Basos: 1 %
EOS (ABSOLUTE): 0.2 10*3/uL (ref 0.0–0.4)
EOS: 4 %
HEMATOCRIT: 42.7 % (ref 37.5–51.0)
HEMOGLOBIN: 14.3 g/dL (ref 12.6–17.7)
IMMATURE GRANS (ABS): 0 10*3/uL (ref 0.0–0.1)
Immature Granulocytes: 0 %
LYMPHS ABS: 1.5 10*3/uL (ref 0.7–3.1)
Lymphs: 30 %
MCH: 31.3 pg (ref 26.6–33.0)
MCHC: 33.5 g/dL (ref 31.5–35.7)
MCV: 93 fL (ref 79–97)
Monocytes Absolute: 0.5 10*3/uL (ref 0.1–0.9)
Monocytes: 9 %
NEUTROS ABS: 2.8 10*3/uL (ref 1.4–7.0)
Neutrophils: 56 %
Platelets: 226 10*3/uL (ref 150–379)
RBC: 4.57 x10E6/uL (ref 4.14–5.80)
RDW: 13.6 % (ref 12.3–15.4)
WBC: 5.1 10*3/uL (ref 3.4–10.8)

## 2016-05-24 LAB — TSH: TSH: 1.55 u[IU]/mL (ref 0.450–4.500)

## 2016-07-18 ENCOUNTER — Other Ambulatory Visit: Payer: Self-pay | Admitting: Family Medicine

## 2017-05-26 ENCOUNTER — Ambulatory Visit (INDEPENDENT_AMBULATORY_CARE_PROVIDER_SITE_OTHER): Admitting: Family Medicine

## 2017-05-26 ENCOUNTER — Encounter: Payer: Self-pay | Admitting: Family Medicine

## 2017-05-26 VITALS — BP 100/78 | HR 48 | Temp 97.4°F | Resp 16 | Ht 72.0 in | Wt 229.0 lb

## 2017-05-26 DIAGNOSIS — C61 Malignant neoplasm of prostate: Secondary | ICD-10-CM | POA: Diagnosis not present

## 2017-05-26 DIAGNOSIS — Z0000 Encounter for general adult medical examination without abnormal findings: Secondary | ICD-10-CM

## 2017-05-26 DIAGNOSIS — K219 Gastro-esophageal reflux disease without esophagitis: Secondary | ICD-10-CM | POA: Diagnosis not present

## 2017-05-26 LAB — POCT URINALYSIS DIPSTICK
Bilirubin, UA: NEGATIVE
GLUCOSE UA: NEGATIVE
Ketones, UA: NEGATIVE
Leukocytes, UA: NEGATIVE
NITRITE UA: NEGATIVE
Protein, UA: NEGATIVE
RBC UA: NEGATIVE
SPEC GRAV UA: 1.01 (ref 1.010–1.025)
Urobilinogen, UA: 0.2 E.U./dL
pH, UA: 7.5 (ref 5.0–8.0)

## 2017-05-26 NOTE — Progress Notes (Signed)
Patient: Frank Peterson, Male    DOB: [birth year], 56 y.o.   MRN: [id number] Visit Date: 05/26/2017  Today's Provider: Wilhemena Durie, MD   Chief Complaint  Patient presents with  . Annual Exam   Subjective:    Annual physical exam Frank Peterson is a 56 y.o. male who presents today for health maintenance and complete physical. He feels fairly well. He is c/o some testicular pain, that has been gradually improving. He saw his urologist for this, who ruled out epididymis and hernia. Urologist believed this was a "pulled muscle" (psosis muscle per pt).  He reports exercising 3 times a week for 60 minutes; walks, jogs, weight training.Marland Kitchen He reports he is sleeping well.  Last colonoscopy- 04/22/2013- BENIGN COLONIC MUCOSA WITH PROMINENT LYMPHOID AGGREGATE. - POLYPOID COLONIC MUCOSA WITH NO SIGNIFICANT PATHOLOGIC CHANGES  - NEGATIVE FOR DYSPLASIA AND MALIGNANCY. Otherwise WNL. ------------------------------------------------------------------   Review of Systems  Constitutional: Negative.   HENT: Negative.   Eyes: Negative.   Respiratory: Negative.   Cardiovascular: Negative.   Gastrointestinal: Negative.   Endocrine: Negative.   Genitourinary: Positive for testicular pain. Negative for decreased urine volume, difficulty urinating, discharge, dysuria, enuresis, flank pain, frequency, genital sores, hematuria, penile pain, penile swelling, scrotal swelling and urgency.       Nocturia times 2.  Musculoskeletal: Negative.   Skin: Negative.   Allergic/Immunologic: Negative.   Neurological: Negative.   Hematological: Negative.   Psychiatric/Behavioral: Negative.     Social History      He  reports that he has never smoked. His smokeless tobacco use includes Chew. He reports that he drinks alcohol. He reports that he does not use drugs.       Social History   Social History  . Marital status: Single    Spouse name: N/A  . Number of children: 0  . Years  of education: bachelor's   Occupational History  .  Selfemployed   Social History Main Topics  . Smoking status: Never Smoker  . Smokeless tobacco: Current User    Types: Chew  . Alcohol use 0.0 oz/week     Comment: 2-3 drinks 4-5 days a week, drinks beer  . Drug use: No  . Sexual activity: Not Currently    Partners: Male   Other Topics Concern  . None   Social History Narrative  . None    Past Medical History:  Diagnosis Date  . Acid reflux 03/23/2015  . Adhesive capsulitis 07/15/2012  . Arthritis, degenerative 03/23/2015  . Broken finger 03/23/2015  . Clavicular fracture, closed, acromial end 12/30/2011  . Clavicular fracture, closed, shaft 02/04/2012  . Fracture of clavicle 03/23/2015  . 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 Prostate cancer ([place]) 05/23/2016  . Failure of erection 03/23/2015  . Fracture of clavicle 03/23/2015  . Broken finger 03/23/2015  . Acid reflux 03/23/2015  . Arthritis, degenerative 03/23/2015  . 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 Adhesive capsulitis 07/15/2012  . 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 Clavicular fracture, closed, shaft 02/04/2012  . Clavicular fracture, closed, acromial end 12/30/2011    Past Surgical History:  Procedure Laterality Date  . APPENDECTOMY  1990  . FRACTURE SURGERY Right 2013   clavicle  . KNEE ARTHROSCOPY Right 1980's  .  PROSTATE SURGERY     HIFU-08/2015  . SHOULDER ARTHROSCOPY Right 2006  . TONSILLECTOMY  1960's   as a child    Family History        Family Status  Relation Status  . Mother Alive  . Father Alive  . Sister Alive  . Sister Alive        His family history includes Cancer in his mother; Diabetes in his father; Healthy in his sister and sister.     No Known Allergies   Current  Outpatient Prescriptions:  .  finasteride (PROSCAR) 5 MG tablet, , Disp: , Rfl:  .  Misc Natural Products (GLUCOSAMINE CHONDROITIN COMPLX) TABS, Take by mouth., Disp: , Rfl:  .  MULTIPLE VITAMIN PO, Take by mouth., Disp: , Rfl:  .  Omega-3 Fatty Acids (FISH OIL BURP-LESS) 500 MG CAPS, Take by mouth., Disp: , Rfl:  .  omeprazole (PRILOSEC) 20 MG capsule, TAKE 1 CAPSULE DAILY, Disp: 90 capsule, Rfl: 3 .  sildenafil (VIAGRA) 100 MG tablet, Take by mouth., Disp: , Rfl:    Patient Care Team: Jerrol Banana., MD as PCP - General (Family Medicine)      Objective:   Vitals: BP 100/78 (BP Location: Left Arm, Patient Position: Sitting, Cuff Size: Large)   Pulse (!) 48   Temp (!) 97.4 F (36.3 C) (Oral)   Resp 16   Ht 6' (1.829 m)   Wt 229 lb (103.9 kg)   BMI 31.06 kg/m    Vitals:   05/26/17 0917  BP: 100/78  Pulse: (!) 48  Resp: 16  Temp: (!) 97.4 F (36.3 C)  TempSrc: Oral  Weight: 229 lb (103.9 kg)  Height: 6' (1.829 m)     Physical Exam  Constitutional: He appears well-developed and well-nourished. No distress.  HENT:  Head: Normocephalic and atraumatic.  Right Ear: Tympanic membrane and external ear normal.  Left Ear: Tympanic membrane and external ear normal.  Nose: Nose normal.  Mouth/Throat: Oropharynx is clear and moist. No oropharyngeal exudate.  Eyes: Pupils are equal, round, and reactive to light. Conjunctivae and EOM are normal. Right eye exhibits no discharge. Left eye exhibits no discharge.  Neck: Normal range of motion. Neck supple. No tracheal deviation present. No thyromegaly present.  Cardiovascular: Normal rate, regular rhythm and normal heart sounds.   Pulmonary/Chest: Effort normal and breath sounds normal. No respiratory distress.  Abdominal: Soft. He exhibits no distension. There is no tenderness.  Small inguinal hernia present  Genitourinary: Rectum normal, prostate normal and penis normal.  Genitourinary Comments: Very small bulge in right  inguinal canal.  Musculoskeletal: Normal range of motion. He exhibits no edema.  Lymphadenopathy:    He has no cervical adenopathy.  Skin: Skin is warm and dry. He is not diaphoretic.  Psychiatric: He has a normal mood and affect. His behavior is normal. Judgment and thought content normal.     Depression Screen PHQ 2/9 Scores 05/26/2017 05/23/2016 08/21/2015  PHQ - 2 Score 2 0 0      Assessment & Plan:     Routine Health Maintenance and Physical Exam  Exercise Activities and Dietary recommendations Goals    None      Immunization History  Administered Date(s) Administered  . Influenza-Unspecified 07/18/2015  . Tdap 05/12/2014    Health Maintenance  Topic Date Due  . Hepatitis C Screening  08-13-1961  . HIV Screening  12/14/1974  . INFLUENZA VACCINE  06/11/2017  . COLONOSCOPY  04/23/2023  . TETANUS/TDAP  05/12/2024     Discussed health benefits of physical activity, and encouraged him to engage in regular exercise appropriate for his age and condition.    --------------------------------------------------------------------  1. Annual physical exam Stable. FU pending lab results. - Lipid panel - TSH - CBC with Differential/Platelet - Comprehensive metabolic panel - POCT urinalysis dipstick  2. Prostate cancer [facility]) F/B urology. FU as scheduled.Will f/u with Dr Yves Dill for nocturia and inguinal hernia.  3. Gastroesophageal reflux disease, esophagitis presence not specified Stable. Continue Omeprazole.  Richard Cranford Mon, MD  [facility]

## 2017-05-27 LAB — COMPREHENSIVE METABOLIC PANEL
ALBUMIN: 4.2 g/dL (ref 3.5–5.5)
ALT: 20 IU/L (ref 0–44)
AST: 17 IU/L (ref 0–40)
Albumin/Globulin Ratio: 1.8 (ref 1.2–2.2)
Alkaline Phosphatase: 59 IU/L (ref 39–117)
BILIRUBIN TOTAL: 0.3 mg/dL (ref 0.0–1.2)
BUN / CREAT RATIO: 12 (ref 9–20)
BUN: 12 mg/dL (ref 6–24)
CALCIUM: 9.1 mg/dL (ref 8.7–10.2)
CHLORIDE: 105 mmol/L (ref 96–106)
CO2: 24 mmol/L (ref 20–29)
CREATININE: 0.99 mg/dL (ref 0.76–1.27)
GFR, EST AFRICAN AMERICAN: 97 mL/min/{1.73_m2} (ref >59)
GFR, EST NON AFRICAN AMERICAN: 84 mL/min/{1.73_m2} (ref >59)
GLUCOSE: 91 mg/dL (ref 65–99)
Globulin, Total: 2.3 g/dL (ref 1.5–4.5)
Potassium: 4.8 mmol/L (ref 3.5–5.2)
Sodium: 142 mmol/L (ref 134–144)
TOTAL PROTEIN: 6.5 g/dL (ref 6.0–8.5)

## 2017-05-27 LAB — CBC WITH DIFFERENTIAL/PLATELET
BASOS ABS: 0 10*3/uL (ref 0.0–0.2)
Basos: 0 %
EOS (ABSOLUTE): 0.2 10*3/uL (ref 0.0–0.4)
Eos: 4 %
Hematocrit: 44 % (ref 37.5–51.0)
Hemoglobin: 14.1 g/dL (ref 13.0–17.7)
IMMATURE GRANS (ABS): 0 10*3/uL (ref 0.0–0.1)
IMMATURE GRANULOCYTES: 0 %
LYMPHS: 38 %
Lymphocytes Absolute: 1.9 10*3/uL (ref 0.7–3.1)
MCH: 31.2 pg (ref 26.6–33.0)
MCHC: 32 g/dL (ref 31.5–35.7)
MCV: 97 fL (ref 79–97)
MONOCYTES: 7 %
Monocytes Absolute: 0.4 10*3/uL (ref 0.1–0.9)
NEUTROS ABS: 2.5 10*3/uL (ref 1.4–7.0)
NEUTROS PCT: 51 %
Platelets: 213 10*3/uL (ref 150–379)
RBC: 4.52 x10E6/uL (ref 4.14–5.80)
RDW: 13.5 % (ref 12.3–15.4)
WBC: 5 10*3/uL (ref 3.4–10.8)

## 2017-05-27 LAB — LIPID PANEL
CHOL/HDL RATIO: 3.6 ratio (ref 0.0–5.0)
Cholesterol, Total: 192 mg/dL (ref 100–199)
HDL: 53 mg/dL (ref >39)
LDL CALC: 109 mg/dL — AB (ref 0–99)
TRIGLYCERIDES: 148 mg/dL (ref 0–149)
VLDL CHOLESTEROL CAL: 30 mg/dL (ref 5–40)

## 2017-05-27 LAB — TSH: TSH: 1.39 u[IU]/mL (ref 0.450–4.500)

## 2017-07-31 ENCOUNTER — Ambulatory Visit (INDEPENDENT_AMBULATORY_CARE_PROVIDER_SITE_OTHER)

## 2017-07-31 DIAGNOSIS — Z23 Encounter for immunization: Secondary | ICD-10-CM

## 2017-10-31 ENCOUNTER — Other Ambulatory Visit: Payer: Self-pay | Admitting: Family Medicine

## 2018-04-02 ENCOUNTER — Other Ambulatory Visit (HOSPITAL_COMMUNITY): Payer: Self-pay | Admitting: Urology

## 2018-04-02 DIAGNOSIS — R972 Elevated prostate specific antigen [PSA]: Secondary | ICD-10-CM

## 2018-04-14 ENCOUNTER — Ambulatory Visit (HOSPITAL_COMMUNITY)
Admission: RE | Admit: 2018-04-14 | Discharge: 2018-04-14 | Disposition: A | Discharge status: Home / Self Care | Source: Ambulatory Visit | Attending: Urology | Admitting: Urology

## 2018-04-14 DIAGNOSIS — N4289 Other specified disorders of prostate: Secondary | ICD-10-CM | POA: Insufficient documentation

## 2018-04-14 DIAGNOSIS — R972 Elevated prostate specific antigen [PSA]: Secondary | ICD-10-CM | POA: Insufficient documentation

## 2018-04-14 MED ORDER — GADOBENATE DIMEGLUMINE 529 MG/ML IV SOLN
20.0000 mL | Freq: Once | INTRAVENOUS | Status: AC | PRN
  Administered 2018-04-14: 20 mL via INTRAVENOUS

## 2018-05-25 ENCOUNTER — Encounter
Admission: RE | Admit: 2018-05-25 | Discharge: 2018-05-25 | Disposition: A | Discharge status: Home / Self Care | Payer: PRIVATE HEALTH INSURANCE | Source: Ambulatory Visit | Attending: Urology | Admitting: Urology

## 2018-05-25 ENCOUNTER — Other Ambulatory Visit: Payer: Self-pay

## 2018-05-25 HISTORY — DX: Malignant (primary) neoplasm, unspecified: C80.1

## 2018-05-25 NOTE — Patient Instructions (Signed)
Your procedure is scheduled on: 06-02-18  Report to Same Day Surgery 2nd floor medical mall [facility] Entrance-take elevator on left to 2nd floor.  Check in with surgery information desk.) To find out your arrival time please call [phone] between 1PM - 3PM on 06-01-18   Remember: Instructions that are not followed completely may result in serious medical risk, up to and including death, or upon the discretion of your surgeon and anesthesiologist your surgery may need to be rescheduled.    _x___ 1. Do not eat food after midnight the night before your procedure. You may drink clear liquids up to 2 hours before you are scheduled to arrive at the hospital for your procedure.  Do not drink clear liquids within 2 hours of your scheduled arrival to the hospital.  Clear liquids include  --Water or Apple juice without pulp  --Clear carbohydrate beverage such as ClearFast or Gatorade  --Black Coffee or Clear Tea (No milk, no creamers, do not add anything to the coffee or Tea Type 1 and type 2 diabetics should only drink water.  No gum chewing or hard candies.     __x__ 2. No Alcohol for 24 hours before or after surgery.   __x__3. No Smoking or e-cigarettes for 24 prior to surgery.  Do not use any chewable tobacco products for at least 6 hour prior to surgery   ____  4. Bring all medications with you on the day of surgery if instructed.    __x__ 5. Notify your doctor if there is any change in your medical condition     (cold, fever, infections).    x___6. On the morning of surgery brush your teeth with toothpaste and water.  You may rinse your mouth with mouth wash if you wish.  Do not swallow any toothpaste or mouthwash.   Do not wear jewelry, make-up, hairpins, clips or nail polish.  Do not wear lotions, powders, or perfumes. You may wear deodorant.  Do not shave 48 hours prior to surgery. Men may shave face and neck.  Do not bring valuables to the hospital.    [facility] is not  responsible for any belongings or valuables.               Contacts, dentures or bridgework may not be worn into surgery.  Leave your suitcase in the car. After surgery it may be brought to your room.  For patients admitted to the hospital, discharge time is determined by your treatment team.  _  Patients discharged the day of surgery will not be allowed to drive home.  You will need someone to drive you home and stay with you the night of your procedure.    Please read over the following fact sheets that you were given:   [facility] Preparing for Surgery and or MRSA Information   _x___ TAKE THE FOLLOWING MEDICATION THE MORNING OF SURGERY WITH A SMALL SIP OF WATER. These include:  1. FINASTERIDE   2. OMEPRAZOLE  3. TAKE AN EXTRA OMEPRAZOLE THE NIGHT BEFORE YOUR SURGERY  4.  5.  6.  ____Fleets enema or Magnesium Citrate as directed.   ____ Use CHG Soap or sage wipes as directed on instruction sheet   ____ Use inhalers on the day of surgery and bring to hospital day of surgery  ____ Stop Metformin and Janumet 2 days prior to surgery.    ____ Take 1/2 of usual insulin dose the night before surgery and none on the morning  surgery.   ____ Follow recommendations from Cardiologist, Pulmonologist or PCP regarding          stopping Aspirin, Coumadin, Plavix ,Eliquis, Effient, or Pradaxa, and Pletal.  X____Stop Anti-inflammatories such as Advil, Aleve, Ibuprofen, Motrin, Naproxen, Naprosyn, Goodies powders or aspirin products NOW-OK to take Tylenol    _x___ Stop supplements until after surgery-STOP [place]   ____ Bring C-Pap to the hospital.

## 2018-05-27 ENCOUNTER — Encounter: Payer: Self-pay | Admitting: Family Medicine

## 2018-06-01 MED ORDER — GENTAMICIN SULFATE 40 MG/ML IJ SOLN
80.0000 mg | Freq: Once | INTRAVENOUS | Status: DC
  Filled 2018-06-01: qty 2

## 2018-06-01 MED ORDER — GENTAMICIN IN SALINE 1.6-0.9 MG/ML-% IV SOLN
80.0000 mg | INTRAVENOUS | Status: AC
  Administered 2018-06-02: 80 mg via INTRAVENOUS
  Filled 2018-06-01: qty 50

## 2018-06-01 MED ORDER — LEVOFLOXACIN IN D5W 500 MG/100ML IV SOLN
500.0000 mg | Freq: Once | INTRAVENOUS | Status: AC
  Administered 2018-06-02: 500 mg via INTRAVENOUS

## 2018-06-01 NOTE — H&P (Signed)
NAME: Frank Peterson, Frank Peterson MEDICAL RECORD [id number] ACCOUNT [id number] DATE OF BIRTH:02/29/1961 FACILITY: [facility] LOCATION: [facility] PHYSICIAN:Suleyman Ehrman Farrel Conners, MD  HISTORY AND PHYSICAL  DATE OF ADMISSION:  06/02/2018  CHIEF COMPLAINT:  Elevated PSA.  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  He underwent right hemi-ablation with extension beyond the midline of the apex with  HIFU in 09/2015.  PSA was noted to be elevated at 4.3 ng/mL on 03/30/2018.  MRI scan of the prostate revealed a 9 x 12 mm PI-RADS grade IV lesion at the left mid gland at the base.  There was also a 7 mm lesion in the left mid gland with a PI-RADS score  of 3.  The patient comes in now for image-guided fusion biopsy with the UroNav system.  ALLERGIES:  No drug allergies.  CURRENT MEDICATIONS:  Aspirin, IBUPROFEN, finasteride, omeprazole, fish oil, glucosamine and chondroitin.  PAST SURGICAL HISTORY:  Appendectomy in 1990, tonsillectomy in 1967, repair of a fractured clavicle in 2013.  PAST AND CURRENT MEDICAL CONDITIONS:  Degenerative joint disease.  REVIEW OF SYSTEMS:  The patient denies chest pain, shortness of breath, diabetes, stroke or hypertension.  SOCIAL HISTORY:  The patient denied tobacco use.  Consumes 12 alcoholic beverages per week.  FAMILY HISTORY:  Father died at age 11 with diabetes.  Mother died of unspecified cancer at age 34.  PHYSICAL EXAMINATION: GENERAL:  A well-nourished white male in no acute distress. HEENT:  Sclerae were clear.  Pupils were equally round, reactive to light and accommodation. NECK:  No palpable cervical adenopathy. PULMONARY:  Lungs were clear to auscultation. CARDIOVASCULAR:  Regular rhythm and rate. ABDOMEN:  Soft, nontender abdomen. GENITOURINARY:  Circumcised testes, smooth, nontender. RECTAL:  Less than 15 g, smooth,  nontender prostate. NEUROMUSCULAR:  Alert and oriented x3.  IMPRESSION: 1.  Elevated PSA. 2.  PI-RADS grade IV lesion, left mid gland of the base. 3.  Pi-RADS grade III lesion of the left mid gland. 4.  History of right apical Gleason's grade 3+3 adenocarcinoma of the prostate, status post focal high-intensity focused ultrasound treatment 2016.  PLAN:  Image-guided prostate biopsy with MRI fusion using UroNav system.  LN/NUANCE  D:05/29/2018 T:05/29/2018 JOB:[id number]

## 2018-06-02 ENCOUNTER — Ambulatory Visit: Admitting: Certified Registered"

## 2018-06-02 ENCOUNTER — Ambulatory Visit
Admission: RE | Admit: 2018-06-02 | Discharge: 2018-06-02 | Disposition: A | Discharge status: Home / Self Care | Source: Ambulatory Visit | Attending: Urology | Admitting: Urology

## 2018-06-02 ENCOUNTER — Encounter: Payer: Self-pay | Admitting: Certified Registered"

## 2018-06-02 ENCOUNTER — Other Ambulatory Visit: Payer: Self-pay

## 2018-06-02 ENCOUNTER — Encounter
Admission: RE | Disposition: A | Discharge status: Home / Self Care | Payer: Self-pay | Source: Ambulatory Visit | Attending: Urology

## 2018-06-02 ENCOUNTER — Other Ambulatory Visit: Payer: Self-pay | Admitting: Urology

## 2018-06-02 DIAGNOSIS — R972 Elevated prostate specific antigen [PSA]: Secondary | ICD-10-CM | POA: Diagnosis present

## 2018-06-02 DIAGNOSIS — N400 Enlarged prostate without lower urinary tract symptoms: Secondary | ICD-10-CM | POA: Insufficient documentation

## 2018-06-02 DIAGNOSIS — I739 Peripheral vascular disease, unspecified: Secondary | ICD-10-CM | POA: Diagnosis not present

## 2018-06-02 DIAGNOSIS — Z79899 Other long term (current) drug therapy: Secondary | ICD-10-CM | POA: Insufficient documentation

## 2018-06-02 DIAGNOSIS — Z8546 Personal history of malignant neoplasm of prostate: Secondary | ICD-10-CM | POA: Insufficient documentation

## 2018-06-02 DIAGNOSIS — K219 Gastro-esophageal reflux disease without esophagitis: Secondary | ICD-10-CM | POA: Insufficient documentation

## 2018-06-02 HISTORY — PX: PROSTATE BIOPSY: SHX241

## 2018-06-02 SURGERY — BIOPSY, PROSTATE
Anesthesia: General

## 2018-06-02 MED ORDER — FENTANYL CITRATE (PF) 100 MCG/2ML IJ SOLN
INTRAMUSCULAR | Status: AC
  Filled 2018-06-02: qty 2

## 2018-06-02 MED ORDER — PROPOFOL 10 MG/ML IV BOLUS
INTRAVENOUS | Status: AC
  Filled 2018-06-02: qty 20

## 2018-06-02 MED ORDER — ONDANSETRON HCL 4 MG/2ML IJ SOLN
INTRAMUSCULAR | Status: DC | PRN
  Administered 2018-06-02: 4 mg via INTRAVENOUS

## 2018-06-02 MED ORDER — EPHEDRINE SULFATE 50 MG/ML IJ SOLN
INTRAMUSCULAR | Status: DC | PRN
  Administered 2018-06-02: 10 mg via INTRAVENOUS

## 2018-06-02 MED ORDER — ONDANSETRON HCL 4 MG/2ML IJ SOLN: 4.0000 mg | Freq: Once | INTRAMUSCULAR | Status: DC | PRN

## 2018-06-02 MED ORDER — LEVOFLOXACIN 500 MG PO TABS: 500.0000 mg | ORAL_TABLET | Freq: Every day | ORAL | 0 refills | Status: DC

## 2018-06-02 MED ORDER — DEXAMETHASONE SODIUM PHOSPHATE 10 MG/ML IJ SOLN
INTRAMUSCULAR | Status: DC | PRN
  Administered 2018-06-02: 10 mg via INTRAVENOUS

## 2018-06-02 MED ORDER — FENTANYL CITRATE (PF) 100 MCG/2ML IJ SOLN: 25.0000 ug | INTRAMUSCULAR | Status: DC | PRN

## 2018-06-02 MED ORDER — PROPOFOL 10 MG/ML IV BOLUS
INTRAVENOUS | Status: DC | PRN
  Administered 2018-06-02: 200 mg via INTRAVENOUS

## 2018-06-02 MED ORDER — LEVOFLOXACIN IN D5W 500 MG/100ML IV SOLN
INTRAVENOUS | Status: AC
  Filled 2018-06-02: qty 100

## 2018-06-02 MED ORDER — LIDOCAINE HCL (CARDIAC) PF 100 MG/5ML IV SOSY
PREFILLED_SYRINGE | INTRAVENOUS | Status: DC | PRN
  Administered 2018-06-02: 100 mg via INTRAVENOUS

## 2018-06-02 MED ORDER — FENTANYL CITRATE (PF) 100 MCG/2ML IJ SOLN
INTRAMUSCULAR | Status: DC | PRN
  Administered 2018-06-02 (×2): 50 ug via INTRAVENOUS

## 2018-06-02 MED ORDER — MIDAZOLAM HCL 2 MG/2ML IJ SOLN
INTRAMUSCULAR | Status: AC
  Filled 2018-06-02: qty 2

## 2018-06-02 MED ORDER — MIDAZOLAM HCL 2 MG/2ML IJ SOLN
INTRAMUSCULAR | Status: DC | PRN
  Administered 2018-06-02: 2 mg via INTRAVENOUS

## 2018-06-02 MED ORDER — LACTATED RINGERS IV SOLN: INTRAVENOUS | Status: DC

## 2018-06-02 SURGICAL SUPPLY — 14 items
COVER MAYO STAND STRL (DRAPES) ×3 IMPLANT
COVER TRANSDUCER ULTRASOUND (MISCELLANEOUS) ×3 IMPLANT
GLOVE BIO SURGEON STRL SZ7 (GLOVE) ×6 IMPLANT
GUIDE NEEDLE ENDOCAV 16-18 CVR (NEEDLE) IMPLANT
INST BIOPSY MAXCORE 18GX25 (NEEDLE) ×3 IMPLANT
NEEDLE BIO TRUPATH DISP 18GX25 (MISCELLANEOUS) ×3 IMPLANT
NEEDLE GUIDE BIOPSY 644068 (NEEDLE) IMPLANT
PROBE BIOSP ALOKA ALPHA6 PROST (MISCELLANEOUS) ×3 IMPLANT
PROBE URONAV BK 8808E 8818 HLD (MISCELLANEOUS) ×1 IMPLANT
SURGILUBE 2OZ TUBE FLIPTOP (MISCELLANEOUS) ×3 IMPLANT
TOWEL OR 17X26 4PK STRL BLUE (TOWEL DISPOSABLE) ×3 IMPLANT
URONAV BK 8808E 8818 PROBE HLD (MISCELLANEOUS) ×3
URONAV MRI FUSION TWO PATIENTS (MISCELLANEOUS) ×3 IMPLANT
URONAV ULTRASOUND (MISCELLANEOUS) ×3 IMPLANT

## 2018-06-02 NOTE — Transfer of Care (Signed)
Immediate Anesthesia Transfer of Care Note  Patient: Frank Peterson  Procedure(s) Performed: URO/NAV FUSION PROSTATE BIOPSY (N/A )  Patient Location: PACU  Anesthesia Type:General  Level of Consciousness: awake, alert  and oriented  Airway & Oxygen Therapy: Patient Spontanous Breathing  Post-op Assessment: Report given to RN and Post -op Vital signs reviewed and stable  Post vital signs: Reviewed and stable  Last Vitals:  Vitals Value Taken Time  BP 128/88 06/02/2018  2:05 PM  Temp 36.6 C 06/02/2018  2:05 PM  Pulse 100 06/02/2018  2:07 PM  Resp 16 06/02/2018  2:05 PM  SpO2 96 % 06/02/2018  2:07 PM  Vitals shown include unvalidated device data.  Last Pain:  Vitals:   06/02/18 1405  TempSrc:   PainSc: 0-No pain         Complications: No apparent anesthesia complications

## 2018-06-02 NOTE — Discharge Instructions (Addendum)
Transrectal Ultrasound-Guided Biopsy A transrectal ultrasound-guided biopsy is a procedure to take samples of tissue from your prostate. Ultrasound images are used to guide the procedure. It is usually done to check the prostate gland for cancer. What happens before the procedure?  Do not eat or drink after midnight on the night before your procedure.  Take medicines as your doctor tells you.  Your doctor may have you stop taking some medicines 5-7 days before the procedure.  You will be given an enema before your procedure. During an enema, a liquid is put into your butt (rectum) to clear out waste.  You may have lab tests the day of your procedure.  Make plans to have someone drive you home. What happens during the procedure?  You will be given medicine to help you relax before the procedure. An IV tube will be put into one of your veins. It will be used to give fluids and medicine.  You will be given medicine to reduce the risk of infection (antibiotic).  You will be placed on your side.  A probe with gel will be put in your butt. This is used to take pictures of your prostate and the area around it.  A medicine to numb the area is put into your prostate.  A biopsy needle is then inserted and guided to your prostate.  Samples of prostate tissue are taken. The needle is removed.  The samples are sent to a lab to be checked. Results are usually back in 2-3 days. What happens after the procedure?  You will be taken to a room where you will be watched until you are doing okay.  You may have some pain in the area around your butt. You will be given medicines for this.  You may be able to go home the same day. Sometimes, an overnight stay in the hospital is needed. This information is not intended to replace advice given to you by your health care provider. Make sure you discuss any questions you have with your health care provider. Document Released: 10/16/2009 Document Revised:  04/04/2016 Document Reviewed: 06/16/2013 Elsevier Interactive Patient Education  2018 [place]   1) The drugs that you were given will stay in your system until tomorrow so for the next 24 hours you should not:  A) Drive an automobile B) Make any legal decisions C) Drink any alcoholic beverage   2) You may resume regular meals tomorrow.  Today it is better to start with liquids and gradually work up to solid foods.  You may eat anything you prefer, but it is better to start with liquids, then soup and crackers, and gradually work up to solid foods.   3) Please notify your doctor immediately if you have any unusual bleeding, trouble breathing, redness and pain at the surgery site, drainage, fever, or pain not relieved by medication.    4) Additional Instructions:        Please contact your physician with any problems or Same Day Surgery at [phone], Monday through Friday 6 am to 4 pm, or [place] at [facility] number at [phone].

## 2018-06-02 NOTE — Anesthesia Post-op Follow-up Note (Signed)
Anesthesia QCDR form completed.        

## 2018-06-02 NOTE — Op Note (Signed)
Preoperative diagnosis: 1.  Elevated PSA                                             2.  Abnormal prostate MRI scan  Postoperative diagnosis: Same  Procedure: Uro-Nav fusion prostate biopsy  Surgeon: Otelia Limes. Yves Dill MD  Anesthesia: General  Indications:See the history and physical. After informed consent the above procedure(s) were requested     Technique and findings: After adequate general anesthesia was obtained the patient was placed to lateral decubitus position and transrectal ultrasound probe was placed.  Ultrasound images were acquired and this with the MRI images.  3 core biopsies were taken of the region of interest 1 at the left mid gland base.  3 core biopsies were then taken from region of interest to the left mid gland.  At this point standard 12 core biopsies were performed.  The ultrasound probe was removed.  Blood loss was minimal.  Seizure was then terminated and patient transferred to the recovery room in stable condition.

## 2018-06-02 NOTE — Anesthesia Procedure Notes (Signed)
Procedure Name: Intubation Date/Time: 06/02/2018 1:22 PM Performed by: Philbert Riser, CRNA Pre-anesthesia Checklist: Patient identified, Emergency Drugs available, Suction available, Patient being monitored and Timeout performed Patient Re-evaluated:Patient Re-evaluated prior to induction Oxygen Delivery Method: Circle system utilized and Simple face mask Preoxygenation: Pre-oxygenation with 100% oxygen Induction Type: IV induction Ventilation: Mask ventilation without difficulty Laryngoscope Size: Mac and 4 Grade View: Grade I Tube type: Oral Tube size: 7.5 mm Number of attempts: 1 Airway Equipment and Method: Stylet Placement Confirmation: ETT inserted through vocal cords under direct vision,  positive ETCO2 and breath sounds checked- equal and bilateral Secured at: 20 cm Tube secured with: Tape Dental Injury: Teeth and Oropharynx as per pre-operative assessment

## 2018-06-02 NOTE — Anesthesia Preprocedure Evaluation (Signed)
Anesthesia Evaluation  Patient identified by MRN, date of birth, ID band Patient awake    Reviewed: Allergy & Precautions, NPO status , Patient's Chart, lab work & pertinent test results  Airway Mallampati: II  TM Distance: >3 FB     Dental   Pulmonary neg pulmonary ROS,    Pulmonary exam normal        Cardiovascular + Peripheral Vascular Disease  Normal cardiovascular exam     Neuro/Psych negative neurological ROS  negative psych ROS   GI/Hepatic Neg liver ROS, GERD  Controlled,  Endo/Other  negative endocrine ROS  Renal/GU negative Renal ROS     Musculoskeletal  (+) Arthritis , Osteoarthritis,    Abdominal Normal abdominal exam  (+)   Peds  Hematology negative hematology ROS (+)   Anesthesia Other Findings   Reproductive/Obstetrics                             Anesthesia Physical Anesthesia Plan  ASA: II  Anesthesia Plan: General   Post-op Pain Management:    Induction: Intravenous  PONV Risk Score and Plan:   Airway Management Planned: Oral ETT  Additional Equipment:   Intra-op Plan:   Post-operative Plan: Extubation in OR  Informed Consent: I have reviewed the patients History and Physical, chart, labs and discussed the procedure including the risks, benefits and alternatives for the proposed anesthesia with the patient or authorized representative who has indicated his/her understanding and acceptance.   Dental advisory given  Plan Discussed with: CRNA and Surgeon  Anesthesia Plan Comments:         Anesthesia Quick Evaluation

## 2018-06-02 NOTE — H&P (Signed)
Date of Initial H&P: 05/29/18 History reviewed, patient examined, no change in status, stable for surgery.

## 2018-06-02 NOTE — Anesthesia Postprocedure Evaluation (Signed)
Anesthesia Post Note  Patient: Frank Peterson  Procedure(s) Performed: URO/NAV FUSION PROSTATE BIOPSY (N/A )  Patient location during evaluation: PACU Anesthesia Type: General Level of consciousness: awake and alert and oriented Pain management: pain level controlled Vital Signs Assessment: post-procedure vital signs reviewed and stable Respiratory status: spontaneous breathing Cardiovascular status: blood pressure returned to baseline Anesthetic complications: no     Last Vitals:  Vitals:   06/02/18 1436 06/02/18 1446  BP: 115/72 131/87  Pulse: (!) 58 62  Resp: 15 16  Temp: 36.7 C (!) 36.2 C  SpO2: 96% 99%    Last Pain:  Vitals:   06/02/18 1446  TempSrc: Temporal  PainSc: 0-No pain                 Anabella Capshaw

## 2018-06-18 ENCOUNTER — Other Ambulatory Visit (HOSPITAL_COMMUNITY): Payer: Self-pay | Admitting: Urology

## 2018-06-18 DIAGNOSIS — C61 Malignant neoplasm of prostate: Secondary | ICD-10-CM

## 2018-06-23 ENCOUNTER — Encounter: Payer: Self-pay | Admitting: Urology

## 2018-06-23 LAB — SURGICAL PATHOLOGY

## 2018-07-02 ENCOUNTER — Encounter (HOSPITAL_COMMUNITY)
Admission: RE | Admit: 2018-07-02 | Discharge: 2018-07-02 | Disposition: A | Discharge status: Home / Self Care | Source: Ambulatory Visit | Attending: Urology | Admitting: Urology

## 2018-07-02 DIAGNOSIS — C61 Malignant neoplasm of prostate: Secondary | ICD-10-CM | POA: Insufficient documentation

## 2018-07-02 MED ORDER — AXUMIN (FLUCICLOVINE F 18) INJECTION
9.2000 | Freq: Once | INTRAVENOUS | Status: AC
  Administered 2018-07-02: 9.2 via INTRAVENOUS

## 2019-08-17 ENCOUNTER — Other Ambulatory Visit: Payer: Self-pay | Admitting: Family Medicine

## 2019-08-17 MED ORDER — OMEPRAZOLE 20 MG PO CPDR: 20.0000 mg | DELAYED_RELEASE_CAPSULE | Freq: Every day | ORAL | 0 refills | Status: AC

## 2019-08-17 NOTE — Telephone Encounter (Signed)
[facility] faxed refill request for the following medications:  omeprazole (PRILOSEC) 20 MG capsule   Please advise.

## 2019-08-17 NOTE — Telephone Encounter (Signed)
Please advise refill? 

## 2019-09-30 ENCOUNTER — Other Ambulatory Visit: Payer: Self-pay | Admitting: Urology

## 2019-09-30 DIAGNOSIS — R972 Elevated prostate specific antigen [PSA]: Secondary | ICD-10-CM

## 2019-10-15 ENCOUNTER — Other Ambulatory Visit: Payer: Self-pay

## 2019-10-15 ENCOUNTER — Ambulatory Visit
Admission: RE | Admit: 2019-10-15 | Discharge: 2019-10-15 | Disposition: A | Discharge status: Home / Self Care | Source: Ambulatory Visit | Attending: Urology | Admitting: Urology

## 2019-10-15 DIAGNOSIS — R972 Elevated prostate specific antigen [PSA]: Secondary | ICD-10-CM | POA: Diagnosis present

## 2019-10-15 LAB — POCT I-STAT CREATININE: Creatinine, Ser: 0.9 mg/dL (ref 0.61–1.24)

## 2019-10-15 MED ORDER — GADOBUTROL 1 MMOL/ML IV SOLN
10.0000 mL | Freq: Once | INTRAVENOUS | Status: AC | PRN
  Administered 2019-10-15: 10 mL via INTRAVENOUS

## 2019-12-31 ENCOUNTER — Other Ambulatory Visit: Payer: Self-pay | Admitting: Urology

## 2019-12-31 DIAGNOSIS — R972 Elevated prostate specific antigen [PSA]: Secondary | ICD-10-CM

## 2020-01-12 ENCOUNTER — Ambulatory Visit
Admission: RE | Admit: 2020-01-12 | Discharge: 2020-01-12 | Disposition: A | Discharge status: Home / Self Care | Source: Ambulatory Visit | Attending: Urology | Admitting: Urology

## 2020-01-12 ENCOUNTER — Other Ambulatory Visit: Payer: Self-pay

## 2020-01-12 DIAGNOSIS — R972 Elevated prostate specific antigen [PSA]: Secondary | ICD-10-CM | POA: Diagnosis present

## 2020-01-12 LAB — POCT I-STAT CREATININE: Creatinine, Ser: 1 mg/dL (ref 0.61–1.24)

## 2020-01-12 MED ORDER — GADOBUTROL 1 MMOL/ML IV SOLN
10.0000 mL | Freq: Once | INTRAVENOUS | Status: AC | PRN
  Administered 2020-01-12: 10 mL via INTRAVENOUS

## 2020-05-18 ENCOUNTER — Ambulatory Visit: Admit: 2020-05-18 | Payer: PRIVATE HEALTH INSURANCE | Admitting: Urology

## 2020-05-18 SURGERY — CYSTOSCOPY
Anesthesia: Choice

## 2020-10-25 ENCOUNTER — Other Ambulatory Visit: Payer: Self-pay | Admitting: Urology

## 2020-10-25 DIAGNOSIS — C61 Malignant neoplasm of prostate: Secondary | ICD-10-CM

## 2020-11-14 ENCOUNTER — Ambulatory Visit
Admission: RE | Admit: 2020-11-14 | Discharge: 2020-11-14 | Disposition: A | Discharge status: Home / Self Care | Source: Ambulatory Visit | Attending: Urology | Admitting: Urology

## 2020-11-14 ENCOUNTER — Other Ambulatory Visit: Payer: Self-pay

## 2020-11-14 DIAGNOSIS — K8020 Calculus of gallbladder without cholecystitis without obstruction: Secondary | ICD-10-CM | POA: Diagnosis not present

## 2020-11-14 DIAGNOSIS — C61 Malignant neoplasm of prostate: Secondary | ICD-10-CM | POA: Insufficient documentation

## 2020-11-14 MED ORDER — PIFLIFOLASTAT F 18 (PYLARIFY) INJECTION
9.0000 | Freq: Once | INTRAVENOUS | Status: AC
  Administered 2020-11-14: 9.64 via INTRAVENOUS

## 2020-11-30 ENCOUNTER — Encounter (INDEPENDENT_AMBULATORY_CARE_PROVIDER_SITE_OTHER): Payer: Self-pay

## 2020-11-30 ENCOUNTER — Encounter: Payer: Self-pay | Admitting: Radiation Oncology

## 2020-11-30 ENCOUNTER — Ambulatory Visit
Admission: RE | Admit: 2020-11-30 | Discharge: 2020-11-30 | Disposition: A | Discharge status: Home / Self Care | Source: Ambulatory Visit | Attending: Radiation Oncology | Admitting: Radiation Oncology

## 2020-11-30 VITALS — BP 137/89 | HR 56 | Temp 98.5°F | Resp 16 | Wt 243.3 lb

## 2020-11-30 DIAGNOSIS — Z79899 Other long term (current) drug therapy: Secondary | ICD-10-CM | POA: Diagnosis not present

## 2020-11-30 DIAGNOSIS — C61 Malignant neoplasm of prostate: Secondary | ICD-10-CM | POA: Diagnosis not present

## 2020-11-30 DIAGNOSIS — K219 Gastro-esophageal reflux disease without esophagitis: Secondary | ICD-10-CM | POA: Insufficient documentation

## 2020-11-30 NOTE — Consult Note (Signed)
NEW PATIENT EVALUATION  Name: Frank Peterson  MRN: [id number]  Date:   11/30/2020     DOB: [birth year]   This 60 y.o. 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  REFERRING PHYSICIAN: Jerrol Banana.,*  CHIEF COMPLAINT:  Chief Complaint  Patient presents with  . Prostate Cancer    DIAGNOSIS: The encounter diagnosis was Prostate cancer ([place]).   PREVIOUS INVESTIGATIONS:  Pathology report reviewed PET CT scan reviewed Clinical notes reviewed  HPI: Patient is a 60 year old male originally diagnosed back in 2016 with 1 focus in the right middle apex of the prostate gland positive for Gleason 6 (3+3 adenocarcinoma. He had PSA progression and underwent retreatment with HIFU back in June 2021. His PSA had gone up to as high as 11.2 back in May and most recently it is 8.2 back in December. He had an MRI scan of his prostate back in March 2021 showing anatomic distortion secondary to HIFU therapy. He also had equivocal findings in the lateral left base to mid gland a PI-RADS 3 lesion. This month he underwent PET CT scan as showing tracer uptake within the anterior right mid to apical gland highly suspicious for residual carcinoma. PET CT scan also showed no findings suggest osseous or abdominal pelvic l nodal metastasis. He specifically denies any increased lower urinary tract symptoms no specific frequency urgency or nocturia. He is now referred for salvage radiation treatment.  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    PAST SURGICAL HISTORY:  Past Surgical History:  Procedure Laterality Date  . APPENDECTOMY  1990  . FRACTURE SURGERY Right 2013   clavicle  . KNEE ARTHROSCOPY Right 1980's  . PROSTATE BIOPSY N/A 06/02/2018   Procedure: URO/NAV FUSION PROSTATE BIOPSY;  Surgeon: Royston Cowper, MD;  Location: [facility] ORS;  Service: Urology;  Laterality: N/A;  . PROSTATE SURGERY     HIFU-08/2015  . SHOULDER ARTHROSCOPY Right 2006  . TONSILLECTOMY  1960's   as a child    FAMILY HISTORY: family history includes Cancer in his mother; Diabetes in his father; Healthy in his sister and sister.  SOCIAL HISTORY:  reports that he has never smoked. His smokeless tobacco use includes chew. He reports current alcohol use. He reports that he does not use drugs.  ALLERGIES: Patient has no known allergies.  MEDICATIONS:  Current Outpatient Medications  Medication Sig Dispense Refill  . finasteride (PROSCAR) 5 MG tablet Take 5 mg by mouth every morning.     . meloxicam (MOBIC) 7.5 MG tablet Take 7.5 mg by mouth daily.    . Misc Natural Products (GLUCOSAMINE CHONDROITIN COMPLX) TABS Take 1 tablet by mouth daily.     . Omega-3 Fatty Acids (FISH OIL BURP-LESS) 500 MG CAPS Take 1 capsule by mouth daily.     Marland Kitchen omeprazole (PRILOSEC) 20 MG capsule Take 1 capsule (20 mg total) by mouth daily. 60 capsule 0  . Propylene Glycol 0.6 % SOLN Place 1 drop into both eyes 3 (three) times daily  as needed (dry eyes).     No current facility-administered medications for this encounter.    ECOG PERFORMANCE STATUS:  0 - Asymptomatic  REVIEW OF SYSTEMS: Patient denies any weight loss, fatigue, weakness, fever, chills or night sweats. Patient denies any loss of vision, blurred vision. Patient denies any ringing  of the ears or hearing loss.  No irregular heartbeat. Patient denies heart murmur or history of fainting. Patient denies any chest pain or pain radiating to her upper extremities. Patient denies any shortness of breath, difficulty breathing at night, cough or hemoptysis. Patient denies any swelling in the lower legs. Patient denies any nausea vomiting, vomiting of blood, or coffee ground material in the vomitus. Patient denies any stomach pain. Patient states has had normal bowel movements no significant constipation or diarrhea. Patient denies any dysuria, hematuria or significant nocturia. Patient denies any problems walking, swelling in the joints or loss of balance. Patient denies any skin changes, loss of hair or loss of weight. Patient denies any excessive worrying or anxiety or significant depression. Patient denies any problems with insomnia. Patient denies excessive thirst, polyuria, polydipsia. Patient denies any swollen glands, patient denies easy bruising or easy bleeding. Patient denies any recent infections, allergies or URI. Patient "s visual fields have not changed significantly in recent time.   PHYSICAL EXAM: BP 137/89 (BP Location: Left Arm, Patient Position: Sitting, Cuff Size: Large)   Pulse (!) 56   Temp 98.5 F (36.9 C) (Tympanic)   Resp 16   Wt 243 lb 4.8 oz (110.4 kg)   BMI 33.00 kg/m  Well-developed well-nourished patient in NAD. HEENT reveals PERLA, EOMI, discs not visualized.  Oral cavity is clear. No oral mucosal lesions are identified. Neck is clear without evidence of cervical or supraclavicular adenopathy. Lungs are clear to A&P. Cardiac examination is essentially unremarkable with regular rate and rhythm without murmur rub or thrill. Abdomen is benign with no organomegaly or masses noted. Motor sensory and DTR levels are equal and symmetric in the upper and lower extremities. Cranial nerves II through XII are grossly intact. Proprioception is intact. No peripheral adenopathy or edema is identified. No  motor or sensory levels are noted. Crude visual fields are within normal range.  LABORATORY DATA: Pathology reports reviewed    RADIOLOGY RESULTS: MRI scans and PET CT scans reviewed compatible with above-stated findings   IMPRESSION: Recurrent adenocarcinoma the prostate improved patient with HIFU x2 with rising PSA as well as focal hypermetabolic activity in the right prostate on PET/CT all consistent with persistent disease.  PLAN: This time elected ahead with image guided IMRT radiation therapy to his prostate and salvage mood. I will try to bring the area of hypermetabolic activity up to [place] treating the remainder of the prostate to [address] using dose painting IMRT treatment planning and delivery. We will also use PET fusion study in my treatment planning. I have asked Dr. Eliberto Ivory to place fiducial markers in his prostate for daily image guided treatment. I also like to start him on androgen deprivation therapy with Eligard and of asked Dr. Eliberto Ivory to provide that also. Risks and benefits of treatment including increased lower urinary tract symptoms diarrhea fatigue alteration of blood count skin reaction and possible exacerbation of side effect secondary to HIFU x2 were discussed with the patient and his wife. They both seem to comprehend my treatment plan well. We will set up CT simulation after markers are placed.  I would like to take this opportunity to thank you  for allowing me to participate in the care of your patient.Noreene Filbert, MD

## 2020-12-14 ENCOUNTER — Ambulatory Visit
Admission: RE | Admit: 2020-12-14 | Discharge: 2020-12-14 | Disposition: A | Discharge status: Home / Self Care | Source: Ambulatory Visit | Attending: Radiation Oncology | Admitting: Radiation Oncology

## 2020-12-14 DIAGNOSIS — C61 Malignant neoplasm of prostate: Secondary | ICD-10-CM | POA: Diagnosis not present

## 2020-12-14 DIAGNOSIS — Z510 Encounter for antineoplastic radiation therapy: Secondary | ICD-10-CM | POA: Diagnosis not present

## 2020-12-19 DIAGNOSIS — Z510 Encounter for antineoplastic radiation therapy: Secondary | ICD-10-CM | POA: Diagnosis not present

## 2020-12-22 ENCOUNTER — Other Ambulatory Visit: Payer: Self-pay | Admitting: *Deleted

## 2020-12-22 DIAGNOSIS — C61 Malignant neoplasm of prostate: Secondary | ICD-10-CM

## 2020-12-25 ENCOUNTER — Ambulatory Visit: Admission: RE | Admit: 2020-12-25 | Source: Ambulatory Visit

## 2020-12-26 ENCOUNTER — Ambulatory Visit
Admission: RE | Admit: 2020-12-26 | Discharge: 2020-12-26 | Disposition: A | Discharge status: Home / Self Care | Source: Ambulatory Visit | Attending: Radiation Oncology | Admitting: Radiation Oncology

## 2020-12-26 DIAGNOSIS — Z510 Encounter for antineoplastic radiation therapy: Secondary | ICD-10-CM | POA: Diagnosis not present

## 2020-12-27 ENCOUNTER — Ambulatory Visit
Admission: RE | Admit: 2020-12-27 | Discharge: 2020-12-27 | Disposition: A | Discharge status: Home / Self Care | Source: Ambulatory Visit | Attending: Radiation Oncology | Admitting: Radiation Oncology

## 2020-12-27 DIAGNOSIS — Z510 Encounter for antineoplastic radiation therapy: Secondary | ICD-10-CM | POA: Diagnosis not present

## 2020-12-28 ENCOUNTER — Ambulatory Visit
Admission: RE | Admit: 2020-12-28 | Discharge: 2020-12-28 | Disposition: A | Discharge status: Home / Self Care | Source: Ambulatory Visit | Attending: Radiation Oncology | Admitting: Radiation Oncology

## 2020-12-28 DIAGNOSIS — Z510 Encounter for antineoplastic radiation therapy: Secondary | ICD-10-CM | POA: Diagnosis not present

## 2020-12-29 ENCOUNTER — Ambulatory Visit
Admission: RE | Admit: 2020-12-29 | Discharge: 2020-12-29 | Disposition: A | Discharge status: Home / Self Care | Source: Ambulatory Visit | Attending: Radiation Oncology | Admitting: Radiation Oncology

## 2020-12-29 DIAGNOSIS — Z510 Encounter for antineoplastic radiation therapy: Secondary | ICD-10-CM | POA: Diagnosis not present

## 2021-01-01 ENCOUNTER — Ambulatory Visit
Admission: RE | Admit: 2021-01-01 | Discharge: 2021-01-01 | Disposition: A | Discharge status: Home / Self Care | Source: Ambulatory Visit | Attending: Radiation Oncology | Admitting: Radiation Oncology

## 2021-01-01 DIAGNOSIS — Z510 Encounter for antineoplastic radiation therapy: Secondary | ICD-10-CM | POA: Diagnosis not present

## 2021-01-02 ENCOUNTER — Ambulatory Visit
Admission: RE | Admit: 2021-01-02 | Discharge: 2021-01-02 | Disposition: A | Discharge status: Home / Self Care | Source: Ambulatory Visit | Attending: Radiation Oncology | Admitting: Radiation Oncology

## 2021-01-02 DIAGNOSIS — Z510 Encounter for antineoplastic radiation therapy: Secondary | ICD-10-CM | POA: Diagnosis not present

## 2021-01-03 ENCOUNTER — Ambulatory Visit
Admission: RE | Admit: 2021-01-03 | Discharge: 2021-01-03 | Disposition: A | Discharge status: Home / Self Care | Source: Ambulatory Visit | Attending: Radiation Oncology | Admitting: Radiation Oncology

## 2021-01-03 DIAGNOSIS — Z510 Encounter for antineoplastic radiation therapy: Secondary | ICD-10-CM | POA: Diagnosis not present

## 2021-01-04 ENCOUNTER — Ambulatory Visit
Admission: RE | Admit: 2021-01-04 | Discharge: 2021-01-04 | Disposition: A | Discharge status: Home / Self Care | Source: Ambulatory Visit | Attending: Radiation Oncology | Admitting: Radiation Oncology

## 2021-01-04 DIAGNOSIS — Z510 Encounter for antineoplastic radiation therapy: Secondary | ICD-10-CM | POA: Diagnosis not present

## 2021-01-05 ENCOUNTER — Ambulatory Visit
Admission: RE | Admit: 2021-01-05 | Discharge: 2021-01-05 | Disposition: A | Discharge status: Home / Self Care | Source: Ambulatory Visit | Attending: Radiation Oncology | Admitting: Radiation Oncology

## 2021-01-05 DIAGNOSIS — Z510 Encounter for antineoplastic radiation therapy: Secondary | ICD-10-CM | POA: Diagnosis not present

## 2021-01-08 ENCOUNTER — Ambulatory Visit
Admission: RE | Admit: 2021-01-08 | Discharge: 2021-01-08 | Disposition: A | Discharge status: Home / Self Care | Source: Ambulatory Visit | Attending: Radiation Oncology | Admitting: Radiation Oncology

## 2021-01-08 DIAGNOSIS — Z510 Encounter for antineoplastic radiation therapy: Secondary | ICD-10-CM | POA: Diagnosis not present

## 2021-01-09 ENCOUNTER — Ambulatory Visit
Admission: RE | Admit: 2021-01-09 | Discharge: 2021-01-09 | Disposition: A | Discharge status: Home / Self Care | Source: Ambulatory Visit | Attending: Radiation Oncology | Admitting: Radiation Oncology

## 2021-01-09 DIAGNOSIS — C61 Malignant neoplasm of prostate: Secondary | ICD-10-CM | POA: Diagnosis present

## 2021-01-09 DIAGNOSIS — Z510 Encounter for antineoplastic radiation therapy: Secondary | ICD-10-CM | POA: Insufficient documentation

## 2021-01-10 ENCOUNTER — Ambulatory Visit
Admission: RE | Admit: 2021-01-10 | Discharge: 2021-01-10 | Disposition: A | Discharge status: Home / Self Care | Source: Ambulatory Visit | Attending: Radiation Oncology | Admitting: Radiation Oncology

## 2021-01-10 ENCOUNTER — Inpatient Hospital Stay: Attending: Radiation Oncology

## 2021-01-10 ENCOUNTER — Other Ambulatory Visit: Payer: Self-pay

## 2021-01-10 DIAGNOSIS — C61 Malignant neoplasm of prostate: Secondary | ICD-10-CM

## 2021-01-10 DIAGNOSIS — Z510 Encounter for antineoplastic radiation therapy: Secondary | ICD-10-CM | POA: Diagnosis not present

## 2021-01-10 LAB — CBC
HCT: 41.9 % (ref 39.0–52.0)
Hemoglobin: 14.3 g/dL (ref 13.0–17.0)
MCH: 32.4 pg (ref 26.0–34.0)
MCHC: 34.1 g/dL (ref 30.0–36.0)
MCV: 94.8 fL (ref 80.0–100.0)
Platelets: 188 10*3/uL (ref 150–400)
RBC: 4.42 MIL/uL (ref 4.22–5.81)
RDW: 11.4 % — ABNORMAL LOW (ref 11.5–15.5)
WBC: 4 10*3/uL (ref 4.0–10.5)
nRBC: 0 % (ref 0.0–0.2)

## 2021-01-11 ENCOUNTER — Ambulatory Visit
Admission: RE | Admit: 2021-01-11 | Discharge: 2021-01-11 | Disposition: A | Discharge status: Home / Self Care | Source: Ambulatory Visit | Attending: Radiation Oncology | Admitting: Radiation Oncology

## 2021-01-11 DIAGNOSIS — Z510 Encounter for antineoplastic radiation therapy: Secondary | ICD-10-CM | POA: Diagnosis not present

## 2021-01-12 ENCOUNTER — Ambulatory Visit
Admission: RE | Admit: 2021-01-12 | Discharge: 2021-01-12 | Disposition: A | Discharge status: Home / Self Care | Source: Ambulatory Visit | Attending: Radiation Oncology | Admitting: Radiation Oncology

## 2021-01-12 DIAGNOSIS — Z510 Encounter for antineoplastic radiation therapy: Secondary | ICD-10-CM | POA: Diagnosis not present

## 2021-01-15 ENCOUNTER — Ambulatory Visit
Admission: RE | Admit: 2021-01-15 | Discharge: 2021-01-15 | Disposition: A | Discharge status: Home / Self Care | Source: Ambulatory Visit | Attending: Radiation Oncology | Admitting: Radiation Oncology

## 2021-01-15 DIAGNOSIS — Z510 Encounter for antineoplastic radiation therapy: Secondary | ICD-10-CM | POA: Diagnosis not present

## 2021-01-16 ENCOUNTER — Ambulatory Visit
Admission: RE | Admit: 2021-01-16 | Discharge: 2021-01-16 | Disposition: A | Discharge status: Home / Self Care | Source: Ambulatory Visit | Attending: Radiation Oncology | Admitting: Radiation Oncology

## 2021-01-16 DIAGNOSIS — Z510 Encounter for antineoplastic radiation therapy: Secondary | ICD-10-CM | POA: Diagnosis not present

## 2021-01-17 ENCOUNTER — Ambulatory Visit
Admission: RE | Admit: 2021-01-17 | Discharge: 2021-01-17 | Disposition: A | Discharge status: Home / Self Care | Source: Ambulatory Visit | Attending: Radiation Oncology | Admitting: Radiation Oncology

## 2021-01-17 DIAGNOSIS — Z510 Encounter for antineoplastic radiation therapy: Secondary | ICD-10-CM | POA: Diagnosis not present

## 2021-01-18 ENCOUNTER — Ambulatory Visit
Admission: RE | Admit: 2021-01-18 | Discharge: 2021-01-18 | Disposition: A | Discharge status: Home / Self Care | Source: Ambulatory Visit | Attending: Radiation Oncology | Admitting: Radiation Oncology

## 2021-01-18 DIAGNOSIS — Z510 Encounter for antineoplastic radiation therapy: Secondary | ICD-10-CM | POA: Diagnosis not present

## 2021-01-19 ENCOUNTER — Ambulatory Visit
Admission: RE | Admit: 2021-01-19 | Discharge: 2021-01-19 | Disposition: A | Discharge status: Home / Self Care | Source: Ambulatory Visit | Attending: Radiation Oncology | Admitting: Radiation Oncology

## 2021-01-19 DIAGNOSIS — Z510 Encounter for antineoplastic radiation therapy: Secondary | ICD-10-CM | POA: Diagnosis not present

## 2021-01-22 ENCOUNTER — Ambulatory Visit
Admission: RE | Admit: 2021-01-22 | Discharge: 2021-01-22 | Disposition: A | Discharge status: Home / Self Care | Source: Ambulatory Visit | Attending: Radiation Oncology | Admitting: Radiation Oncology

## 2021-01-22 DIAGNOSIS — Z510 Encounter for antineoplastic radiation therapy: Secondary | ICD-10-CM | POA: Diagnosis not present

## 2021-01-23 ENCOUNTER — Ambulatory Visit
Admission: RE | Admit: 2021-01-23 | Discharge: 2021-01-23 | Disposition: A | Discharge status: Home / Self Care | Source: Ambulatory Visit | Attending: Radiation Oncology | Admitting: Radiation Oncology

## 2021-01-23 DIAGNOSIS — Z510 Encounter for antineoplastic radiation therapy: Secondary | ICD-10-CM | POA: Diagnosis not present

## 2021-01-24 ENCOUNTER — Inpatient Hospital Stay

## 2021-01-24 ENCOUNTER — Ambulatory Visit
Admission: RE | Admit: 2021-01-24 | Discharge: 2021-01-24 | Disposition: A | Discharge status: Home / Self Care | Source: Ambulatory Visit | Attending: Radiation Oncology | Admitting: Radiation Oncology

## 2021-01-24 DIAGNOSIS — Z510 Encounter for antineoplastic radiation therapy: Secondary | ICD-10-CM | POA: Diagnosis not present

## 2021-01-24 DIAGNOSIS — C61 Malignant neoplasm of prostate: Secondary | ICD-10-CM

## 2021-01-24 LAB — CBC
HCT: 40.3 % (ref 39.0–52.0)
Hemoglobin: 13.9 g/dL (ref 13.0–17.0)
MCH: 32.2 pg (ref 26.0–34.0)
MCHC: 34.5 g/dL (ref 30.0–36.0)
MCV: 93.3 fL (ref 80.0–100.0)
Platelets: 153 10*3/uL (ref 150–400)
RBC: 4.32 MIL/uL (ref 4.22–5.81)
RDW: 11.5 % (ref 11.5–15.5)
WBC: 3.6 10*3/uL — ABNORMAL LOW (ref 4.0–10.5)
nRBC: 0 % (ref 0.0–0.2)

## 2021-01-25 ENCOUNTER — Ambulatory Visit
Admission: RE | Admit: 2021-01-25 | Discharge: 2021-01-25 | Disposition: A | Discharge status: Home / Self Care | Source: Ambulatory Visit | Attending: Radiation Oncology | Admitting: Radiation Oncology

## 2021-01-25 DIAGNOSIS — Z510 Encounter for antineoplastic radiation therapy: Secondary | ICD-10-CM | POA: Diagnosis not present

## 2021-01-26 ENCOUNTER — Ambulatory Visit
Admission: RE | Admit: 2021-01-26 | Discharge: 2021-01-26 | Disposition: A | Discharge status: Home / Self Care | Source: Ambulatory Visit | Attending: Radiation Oncology | Admitting: Radiation Oncology

## 2021-01-26 DIAGNOSIS — Z510 Encounter for antineoplastic radiation therapy: Secondary | ICD-10-CM | POA: Diagnosis not present

## 2021-01-29 ENCOUNTER — Ambulatory Visit: Admission: RE | Admit: 2021-01-29 | Source: Ambulatory Visit

## 2021-01-29 ENCOUNTER — Ambulatory Visit

## 2021-01-30 ENCOUNTER — Ambulatory Visit
Admission: RE | Admit: 2021-01-30 | Discharge: 2021-01-30 | Disposition: A | Discharge status: Home / Self Care | Source: Ambulatory Visit | Attending: Radiation Oncology | Admitting: Radiation Oncology

## 2021-01-30 DIAGNOSIS — Z510 Encounter for antineoplastic radiation therapy: Secondary | ICD-10-CM | POA: Diagnosis not present

## 2021-01-31 ENCOUNTER — Ambulatory Visit
Admission: RE | Admit: 2021-01-31 | Discharge: 2021-01-31 | Disposition: A | Discharge status: Home / Self Care | Source: Ambulatory Visit | Attending: Radiation Oncology | Admitting: Radiation Oncology

## 2021-01-31 DIAGNOSIS — Z510 Encounter for antineoplastic radiation therapy: Secondary | ICD-10-CM | POA: Diagnosis not present

## 2021-02-01 ENCOUNTER — Ambulatory Visit
Admission: RE | Admit: 2021-02-01 | Discharge: 2021-02-01 | Disposition: A | Discharge status: Home / Self Care | Source: Ambulatory Visit | Attending: Radiation Oncology | Admitting: Radiation Oncology

## 2021-02-01 DIAGNOSIS — Z510 Encounter for antineoplastic radiation therapy: Secondary | ICD-10-CM | POA: Diagnosis not present

## 2021-02-02 ENCOUNTER — Ambulatory Visit
Admission: RE | Admit: 2021-02-02 | Discharge: 2021-02-02 | Disposition: A | Discharge status: Home / Self Care | Source: Ambulatory Visit | Attending: Radiation Oncology | Admitting: Radiation Oncology

## 2021-02-02 DIAGNOSIS — Z510 Encounter for antineoplastic radiation therapy: Secondary | ICD-10-CM | POA: Diagnosis not present

## 2021-02-05 ENCOUNTER — Ambulatory Visit
Admission: RE | Admit: 2021-02-05 | Discharge: 2021-02-05 | Disposition: A | Discharge status: Home / Self Care | Source: Ambulatory Visit | Attending: Radiation Oncology | Admitting: Radiation Oncology

## 2021-02-05 DIAGNOSIS — Z510 Encounter for antineoplastic radiation therapy: Secondary | ICD-10-CM | POA: Diagnosis not present

## 2021-02-06 ENCOUNTER — Ambulatory Visit
Admission: RE | Admit: 2021-02-06 | Discharge: 2021-02-06 | Disposition: A | Discharge status: Home / Self Care | Source: Ambulatory Visit | Attending: Radiation Oncology | Admitting: Radiation Oncology

## 2021-02-06 DIAGNOSIS — Z510 Encounter for antineoplastic radiation therapy: Secondary | ICD-10-CM | POA: Diagnosis not present

## 2021-02-07 ENCOUNTER — Ambulatory Visit
Admission: RE | Admit: 2021-02-07 | Discharge: 2021-02-07 | Disposition: A | Discharge status: Home / Self Care | Source: Ambulatory Visit | Attending: Radiation Oncology | Admitting: Radiation Oncology

## 2021-02-07 ENCOUNTER — Inpatient Hospital Stay

## 2021-02-07 DIAGNOSIS — Z510 Encounter for antineoplastic radiation therapy: Secondary | ICD-10-CM | POA: Diagnosis not present

## 2021-02-08 ENCOUNTER — Ambulatory Visit
Admission: RE | Admit: 2021-02-08 | Discharge: 2021-02-08 | Disposition: A | Discharge status: Home / Self Care | Source: Ambulatory Visit | Attending: Radiation Oncology | Admitting: Radiation Oncology

## 2021-02-08 DIAGNOSIS — Z510 Encounter for antineoplastic radiation therapy: Secondary | ICD-10-CM | POA: Diagnosis not present

## 2021-02-09 ENCOUNTER — Ambulatory Visit
Admission: RE | Admit: 2021-02-09 | Discharge: 2021-02-09 | Disposition: A | Discharge status: Home / Self Care | Source: Ambulatory Visit | Attending: Radiation Oncology | Admitting: Radiation Oncology

## 2021-02-09 DIAGNOSIS — C61 Malignant neoplasm of prostate: Secondary | ICD-10-CM | POA: Diagnosis not present

## 2021-02-09 DIAGNOSIS — Z510 Encounter for antineoplastic radiation therapy: Secondary | ICD-10-CM | POA: Diagnosis present

## 2021-02-12 ENCOUNTER — Ambulatory Visit
Admission: RE | Admit: 2021-02-12 | Discharge: 2021-02-12 | Disposition: A | Discharge status: Home / Self Care | Source: Ambulatory Visit | Attending: Radiation Oncology | Admitting: Radiation Oncology

## 2021-02-12 DIAGNOSIS — Z510 Encounter for antineoplastic radiation therapy: Secondary | ICD-10-CM | POA: Diagnosis not present

## 2021-02-13 ENCOUNTER — Ambulatory Visit
Admission: RE | Admit: 2021-02-13 | Discharge: 2021-02-13 | Disposition: A | Discharge status: Home / Self Care | Source: Ambulatory Visit | Attending: Radiation Oncology | Admitting: Radiation Oncology

## 2021-02-13 DIAGNOSIS — Z510 Encounter for antineoplastic radiation therapy: Secondary | ICD-10-CM | POA: Diagnosis not present

## 2021-02-14 ENCOUNTER — Ambulatory Visit
Admission: RE | Admit: 2021-02-14 | Discharge: 2021-02-14 | Disposition: A | Discharge status: Home / Self Care | Source: Ambulatory Visit | Attending: Radiation Oncology | Admitting: Radiation Oncology

## 2021-02-14 DIAGNOSIS — Z510 Encounter for antineoplastic radiation therapy: Secondary | ICD-10-CM | POA: Diagnosis not present

## 2021-02-15 ENCOUNTER — Ambulatory Visit
Admission: RE | Admit: 2021-02-15 | Discharge: 2021-02-15 | Disposition: A | Discharge status: Home / Self Care | Source: Ambulatory Visit | Attending: Radiation Oncology | Admitting: Radiation Oncology

## 2021-02-15 DIAGNOSIS — Z510 Encounter for antineoplastic radiation therapy: Secondary | ICD-10-CM | POA: Diagnosis not present

## 2021-02-16 ENCOUNTER — Ambulatory Visit
Admission: RE | Admit: 2021-02-16 | Discharge: 2021-02-16 | Disposition: A | Discharge status: Home / Self Care | Source: Ambulatory Visit | Attending: Radiation Oncology | Admitting: Radiation Oncology

## 2021-02-16 DIAGNOSIS — Z510 Encounter for antineoplastic radiation therapy: Secondary | ICD-10-CM | POA: Diagnosis not present

## 2021-02-19 ENCOUNTER — Ambulatory Visit
Admission: RE | Admit: 2021-02-19 | Discharge: 2021-02-19 | Disposition: A | Discharge status: Home / Self Care | Source: Ambulatory Visit | Attending: Radiation Oncology | Admitting: Radiation Oncology

## 2021-02-19 ENCOUNTER — Ambulatory Visit

## 2021-02-19 DIAGNOSIS — Z510 Encounter for antineoplastic radiation therapy: Secondary | ICD-10-CM | POA: Diagnosis not present

## 2021-02-20 ENCOUNTER — Ambulatory Visit

## 2021-02-20 ENCOUNTER — Ambulatory Visit
Admission: RE | Admit: 2021-02-20 | Discharge: 2021-02-20 | Disposition: A | Discharge status: Home / Self Care | Source: Ambulatory Visit | Attending: Radiation Oncology | Admitting: Radiation Oncology

## 2021-02-20 DIAGNOSIS — Z510 Encounter for antineoplastic radiation therapy: Secondary | ICD-10-CM | POA: Diagnosis not present

## 2021-02-21 ENCOUNTER — Ambulatory Visit

## 2021-02-22 ENCOUNTER — Ambulatory Visit

## 2021-03-22 ENCOUNTER — Encounter (INDEPENDENT_AMBULATORY_CARE_PROVIDER_SITE_OTHER): Payer: Self-pay

## 2021-03-22 ENCOUNTER — Ambulatory Visit
Admission: RE | Admit: 2021-03-22 | Discharge: 2021-03-22 | Disposition: A | Discharge status: Home / Self Care | Source: Ambulatory Visit | Attending: Radiation Oncology | Admitting: Radiation Oncology

## 2021-03-22 ENCOUNTER — Encounter: Payer: Self-pay | Admitting: Radiation Oncology

## 2021-03-22 VITALS — BP 117/78 | HR 54 | Temp 97.4°F | Resp 16 | Wt 239.9 lb

## 2021-03-22 DIAGNOSIS — R351 Nocturia: Secondary | ICD-10-CM | POA: Insufficient documentation

## 2021-03-22 DIAGNOSIS — C61 Malignant neoplasm of prostate: Secondary | ICD-10-CM | POA: Insufficient documentation

## 2021-03-22 DIAGNOSIS — Z923 Personal history of irradiation: Secondary | ICD-10-CM | POA: Insufficient documentation

## 2021-03-22 NOTE — Progress Notes (Signed)
Radiation Oncology Follow up Note  Name: Frank Peterson   Date:   03/22/2021 MRN:  [id number] DOB: 1961-05-02    This 60 y.o. male presents to the clinic today for 1 month follow-up status post salvage radiation therapy and patient undergoing HIFU x2 for Gleason 6 adenocarcinoma with biochemical progression.  REFERRING PROVIDER: Jerrol Banana.,*  HPI: Patient is a 60 year old male now at 1 month having completed salvage radiation therapy to his prostate status post HIFU x2.  Seen today in routine follow-up he is doing well his urinary symptoms have decreased.  He does have nocturia x1.  No bowel problems specifically denies diarrhea.  Energy level is good..  COMPLICATIONS OF TREATMENT: none  FOLLOW UP COMPLIANCE: keeps appointments   PHYSICAL EXAM:  BP 117/78   Pulse (!) 54   Temp (!) 97.4 F (36.3 C) (Tympanic)   Resp 16   Wt 239 lb 14.4 oz (108.8 kg)   BMI 32.54 kg/m  Well-developed well-nourished patient in NAD. HEENT reveals PERLA, EOMI, discs not visualized.  Oral cavity is clear. No oral mucosal lesions are identified. Neck is clear without evidence of cervical or supraclavicular adenopathy. Lungs are clear to A&P. Cardiac examination is essentially unremarkable with regular rate and rhythm without murmur rub or thrill. Abdomen is benign with no organomegaly or masses noted. Motor sensory and DTR levels are equal and symmetric in the upper and lower extremities. Cranial nerves II through XII are grossly intact. Proprioception is intact. No peripheral adenopathy or edema is identified. No motor or sensory levels are noted. Crude visual fields are within normal range.  RADIOLOGY RESULTS: No current films for review  PLAN: Present time patient is at a very low side effect profile from his salvage radiation therapy.  We will start checking his PSA in about 3 months and have asked to see him back at that time for follow-up with a PSA prior to that visit.  Patient knows to  call with any concerns.  I would like to take this opportunity to thank you for allowing me to participate in the care of your patient.Noreene Filbert, MD

## 2021-04-02 ENCOUNTER — Encounter: Payer: Self-pay | Admitting: *Deleted

## 2021-06-23 IMAGING — PT NM PET TUM IMG SKULL BASE T - THIGH
1 of 10 series · 1 of 25 positions shown · non-contrast
Comparison: 07/02/2018 [facility]umin PET.  Prostate MRI of 01/12/2020.

CLINICAL DATA: Malignant neoplasm of prostate. Second HIFU
treatment [DATE]. Right knee pain.

EXAM:
NUCLEAR MEDICINE PET SKULL BASE TO THIGH
TECHNIQUE: 6.9 mCi F18 Piflufolastat (Pylarify) was injected intravenously.
Full-ring PET imaging was performed from the skull base to thigh
after the radiotracer. CT data was obtained and used for attenuation
correction and anatomic localization.

[Series 3: ct wb 5.0 b30f · axial · 5.0mm · 0.98mm/px · 1 of 329 slices shown]
[im 329/329  brain]
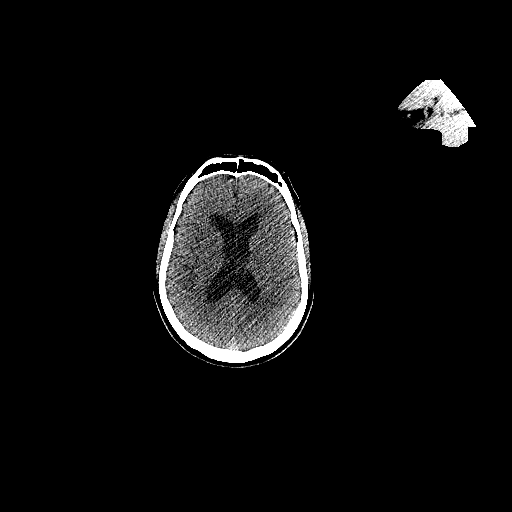

[1 of 25 positions shown; findings below may reference images not displayed]

FINDINGS: NECK

No radiotracer activity in neck lymph nodes.

Incidental CT finding: No cervical adenopathy.

CHEST

No pulmonary parenchymal or thoracic nodal hypermetabolism.

Incidental CT finding: Mild cardiomegaly.  Tortuous thoracic aorta.

ABDOMEN/PELVIS

Prostate: Focal tracer affinity involving the anterior right the mid
to apical prostate. Example at a S.U.V. max of 9.0, including at
267/3.

Lymph nodes: No abnormal radiotracer accumulation within pelvic or
abdominal nodes.

Liver: No evidence of liver metastasis

Incidental CT finding: Small gallstones. Normal adrenal glands.
Small bilateral hydroceles.

SKELETON

No focal activity to suggest skeletal metastasis. Remote right
clavicular fixation.
IMPRESSION: 1. Tracer uptake within the anterior right mid to apical gland,
highly suspicious for residual disease.
2. No findings to suggest abdominopelvic nodal or osseous
metastasis.
3. Cholelithiasis.

## 2021-06-26 ENCOUNTER — Other Ambulatory Visit: Payer: Self-pay | Admitting: *Deleted

## 2021-06-26 DIAGNOSIS — C61 Malignant neoplasm of prostate: Secondary | ICD-10-CM

## 2021-06-27 ENCOUNTER — Inpatient Hospital Stay: Attending: Radiation Oncology

## 2021-06-27 DIAGNOSIS — C61 Malignant neoplasm of prostate: Secondary | ICD-10-CM | POA: Diagnosis present

## 2021-06-27 LAB — CBC
HCT: 40.8 % (ref 39.0–52.0)
Hemoglobin: 13.9 g/dL (ref 13.0–17.0)
MCH: 33 pg (ref 26.0–34.0)
MCHC: 34.1 g/dL (ref 30.0–36.0)
MCV: 96.9 fL (ref 80.0–100.0)
Platelets: 209 10*3/uL (ref 150–400)
RBC: 4.21 MIL/uL — ABNORMAL LOW (ref 4.22–5.81)
RDW: 11.8 % (ref 11.5–15.5)
WBC: 3.7 10*3/uL — ABNORMAL LOW (ref 4.0–10.5)
nRBC: 0 % (ref 0.0–0.2)

## 2021-06-27 LAB — PSA: Prostatic Specific Antigen: 0.64 ng/mL (ref 0.00–4.00)

## 2021-07-02 ENCOUNTER — Ambulatory Visit: Payer: PRIVATE HEALTH INSURANCE | Admitting: Radiation Oncology

## 2021-07-06 ENCOUNTER — Ambulatory Visit
Admission: RE | Admit: 2021-07-06 | Discharge: 2021-07-06 | Disposition: A | Discharge status: Home / Self Care | Source: Ambulatory Visit | Attending: Radiation Oncology | Admitting: Radiation Oncology

## 2021-07-06 ENCOUNTER — Encounter: Payer: Self-pay | Admitting: Radiation Oncology

## 2021-07-06 VITALS — BP 136/92 | HR 71 | Wt 243.0 lb

## 2021-07-06 DIAGNOSIS — C61 Malignant neoplasm of prostate: Secondary | ICD-10-CM

## 2021-07-06 NOTE — Progress Notes (Signed)
Radiation Oncology Follow up Note  Name: Frank Peterson   Date:   07/06/2021 MRN:  [id number] DOB: 09/25/1961    This 60 y.o. male presents to the clinic today for 39-monthfollow-up status post salvage radiation therapy and patient undergoing HIFU x2 for Gleason 6 adenocarcinoma with biochemical progression.  REFERRING PROVIDER: GJerrol Banana,*  HPI: Patient is a 60year old male now out 4 months having completed salvage radiation therapy and patient receiving HIFU x2 with biochemical failure.  Seen today in routine follow-up he is doing well he is currently on Eligard and having some hot flashes taking vitamin D supplements for that specifically denies any increased lower urinary tract symptoms diarrhea or fatigue..Marland Kitchen His most recent PSA is 0.64.  His PSA had been up to 8.2 back in December  COMPLICATIONS OF TREATMENT: none  FOLLOW UP COMPLIANCE: keeps appointments   PHYSICAL EXAM:  BP (!) 136/92   Pulse 71   Wt 243 lb (110.2 kg)   BMI 32.96 kg/m  Well-developed well-nourished patient in NAD. HEENT reveals PERLA, EOMI, discs not visualized.  Oral cavity is clear. No oral mucosal lesions are identified. Neck is clear without evidence of cervical or supraclavicular adenopathy. Lungs are clear to A&P. Cardiac examination is essentially unremarkable with regular rate and rhythm without murmur rub or thrill. Abdomen is benign with no organomegaly or masses noted. Motor sensory and DTR levels are equal and symmetric in the upper and lower extremities. Cranial nerves II through XII are grossly intact. Proprioception is intact. No peripheral adenopathy or edema is identified. No motor or sensory levels are noted. Crude visual fields are within normal range.  RADIOLOGY RESULTS: No current films to review  PLAN: Present time is an excellent response with lowering of his PSA.  He will continue on Eligard by Dr. WEliberto Ivory  I have asked to see him back in 6 months for follow-up with a PSA at  that time.  Patient knows to call with any concerns.  I would like to take this opportunity to thank you for allowing me to participate in the care of your patient..Noreene Filbert MD

## 2021-10-27 DIAGNOSIS — E785 Hyperlipidemia, unspecified: Secondary | ICD-10-CM | POA: Insufficient documentation

## 2022-01-04 ENCOUNTER — Inpatient Hospital Stay: Attending: Radiation Oncology

## 2022-01-04 ENCOUNTER — Other Ambulatory Visit: Payer: Self-pay

## 2022-01-04 DIAGNOSIS — C61 Malignant neoplasm of prostate: Secondary | ICD-10-CM | POA: Insufficient documentation

## 2022-01-04 LAB — PSA: Prostatic Specific Antigen: 0.19 ng/mL (ref 0.00–4.00)

## 2022-01-07 ENCOUNTER — Other Ambulatory Visit: Payer: Self-pay | Admitting: *Deleted

## 2022-01-07 ENCOUNTER — Encounter: Payer: Self-pay | Admitting: Radiation Oncology

## 2022-01-07 ENCOUNTER — Other Ambulatory Visit: Payer: Self-pay

## 2022-01-07 ENCOUNTER — Ambulatory Visit
Admission: RE | Admit: 2022-01-07 | Discharge: 2022-01-07 | Disposition: A | Discharge status: Home / Self Care | Source: Ambulatory Visit | Attending: Radiation Oncology | Admitting: Radiation Oncology

## 2022-01-07 VITALS — BP 159/114 | HR 79 | Temp 97.6°F | Wt 238.0 lb

## 2022-01-07 DIAGNOSIS — C61 Malignant neoplasm of prostate: Secondary | ICD-10-CM | POA: Diagnosis not present

## 2022-01-07 DIAGNOSIS — I10 Essential (primary) hypertension: Secondary | ICD-10-CM | POA: Diagnosis not present

## 2022-01-07 DIAGNOSIS — Z923 Personal history of irradiation: Secondary | ICD-10-CM | POA: Diagnosis not present

## 2022-01-07 NOTE — Progress Notes (Signed)
Radiation Oncology Follow up Note  Name: Frank Peterson   Date:   01/07/2022 MRN:  [id number] DOB: Jun 02, 1961    This 61 y.o. male presents to the clinic today for 57-month follow-up status post salvage radiation therapy in a patient undergoing HIFU x2 for Gleason 6 adenocarcinoma the prostate with biochemical progression.  REFERRING PROVIDER: Jerrol Banana.,*  HPI: Patient is a 61 year old male now out 10 months having completed salvage radiation therapy in a patient status post HIFU x2.  He is seen today in routine follow-up and is doing fairly well he does have some urinary frequency urgency and nocturia.  He is not on any Flomax or any antispasmodic medication.  His most recent PSA.  His 0.19 down from 0.646 months prior.  He is under currently under ADT therapy through urology.  He has been having some problems with hypertension and attributes that to ADT therapy although of asked him to discuss that and treatment with his PMD.  COMPLICATIONS OF TREATMENT: none  FOLLOW UP COMPLIANCE: keeps appointments   PHYSICAL EXAM:  BP (!) 159/114    Pulse 79    Temp 97.6 F (36.4 C)    Wt 238 lb (108 kg)    BMI 32.28 kg/m  Well-developed well-nourished patient in NAD. HEENT reveals PERLA, EOMI, discs not visualized.  Oral cavity is clear. No oral mucosal lesions are identified. Neck is clear without evidence of cervical or supraclavicular adenopathy. Lungs are clear to A&P. Cardiac examination is essentially unremarkable with regular rate and rhythm without murmur rub or thrill. Abdomen is benign with no organomegaly or masses noted. Motor sensory and DTR levels are equal and symmetric in the upper and lower extremities. Cranial nerves II through XII are grossly intact. Proprioception is intact. No peripheral adenopathy or edema is identified. No motor or sensory levels are noted. Crude visual fields are within normal range.  RADIOLOGY RESULTS: No current films for review  PLAN:  Present time is under excellent biochemical control of his prostate cancer.  I have asked him to address his urinary frequency nocturia with Dr. Eliberto Ivory his urologist he may benefit from medication.  I have also asked him to check with Dr. Rosanna Randy about his hypertension although he is attributing to ADT therapy needs to be addressed and he will take care of that.  I have otherwise asked to see him back in 6 months for follow-up with repeat PSA.  Patient knows to call with any concerns.  I would like to take this opportunity to thank you for allowing me to participate in the care of your patient.Noreene Filbert, MD

## 2022-01-24 ENCOUNTER — Ambulatory Visit: Payer: Self-pay | Admitting: *Deleted

## 2022-01-24 NOTE — Telephone Encounter (Signed)
Reason for Disposition ? Systolic BP  >= 250 OR Diastolic >= 539 ? ?Answer Assessment - Initial Assessment Questions ?1. BLOOD PRESSURE: "What is the blood pressure?" "Did you take at least two measurements 5 minutes apart?" ?    140/110, 150/120 ?2. ONSET: "When did you take your blood pressure?" ?    Today, last night ?3. HOW: "How did you obtain the blood pressure?" (e.g., visiting nurse, automatic home BP monitor) ?    Automatic cuff- arm ?4. HISTORY: "Do you have a history of high blood pressure?" ?    no ?5. MEDICATIONS: "Are you taking any medications for blood pressure?" "Have you missed any doses recently?" ?    HRT- Elegard - 1 year- concerned about SE- patient is off injection now ?6. OTHER SYMPTOMS: "Do you have any symptoms?" (e.g., headache, chest pain, blurred vision, difficulty breathing, weakness) ?    No symptoms ?7. PREGNANCY: "Is there any chance you are pregnant?" "When was your last menstrual period?" ?    *No Answer* ? ?Protocols used: Blood Pressure - High-A-AH ? ?

## 2022-01-24 NOTE — Telephone Encounter (Signed)
?  Chief Complaint: elevated BP ?Symptoms: patient is very concerned about his BP readings- he is requesting appointment to discuss ?Frequency: patient has been monitoring BP since it has been elevated at specialty offices ?Pertinent Negatives: Patient denies headache, chest pain, blurred vision, difficulty breathing, weakness ?Disposition: '[]'$ ED /'[]'$ Urgent Care (no appt availability in office) / '[x]'$ Appointment(In office/virtual)/ '[]'$  [place] [place] [facility]/ '[]'$ Home Care/ '[]'$ Refused Recommended Disposition /'[]'$ [place] [facility]/ '[]'$  Follow-up with PCP ?Additional Notes:    ?

## 2022-01-25 ENCOUNTER — Other Ambulatory Visit: Payer: Self-pay

## 2022-01-25 ENCOUNTER — Encounter: Payer: Self-pay | Admitting: Physician Assistant

## 2022-01-25 ENCOUNTER — Ambulatory Visit (INDEPENDENT_AMBULATORY_CARE_PROVIDER_SITE_OTHER): Admitting: Physician Assistant

## 2022-01-25 VITALS — BP 130/100 | HR 80 | Temp 97.2°F | Resp 16 | Ht 72.0 in | Wt 238.6 lb

## 2022-01-25 DIAGNOSIS — I10 Essential (primary) hypertension: Secondary | ICD-10-CM | POA: Diagnosis not present

## 2022-01-25 MED ORDER — HYDROCHLOROTHIAZIDE 12.5 MG PO TABS: 12.5000 mg | ORAL_TABLET | Freq: Every day | ORAL | 1 refills | Status: DC

## 2022-01-25 NOTE — Progress Notes (Signed)
?  ? ?I,Joseline E Rosas,acting as a scribe for [employer], PA-C.,have documented all relevant documentation on the behalf of [place], PA-C,as directed by  [employer], PA-C while in the presence of Dannis Deroche E Onofre Gains, PA-C.  ? ?Established patient visit ? ? ?Patient: Frank Peterson   DOB: December 17, 1960   61 y.o. Male  MRN: [id number] ?Visit Date: 01/25/2022 ? ?Today's healthcare provider: Dani Gobble [place] Duerst, PA-C  ?Introduced myself to the patient as a Journalist, newspaper and provided education on APPs in clinical practice.  ? ? ?Chief Complaint  ?Patient presents with  ? Hypertension  ? ?Subjective  ?  ?HPI ?HPI   ? ? Hypertension   ?This is a new problem.  Anxiety: Absent.  Blurred vision: Absent.  Chest pain: Absent.  Chest pressure/discomfort: Absent.  Dyspnea: Absent.  Headaches: Absent.  Lower extremity edema: Absent.  Orthopnea: Absent.  Palpitations: Absent.  Paroxysmal nocturnal dyspnea: Absent.  Syncope: Absent.  The patient exercises three times a week (Walks 4 times a week).  Patient's diet is generally healthy. ? ?  ?  ?Last edited by Doristine Devoid, CMA on 01/25/2022 10:47 AM.  ?  ? ?Patient's BP cuff read in office today 134/93 P:77 . Home BP readings have been 140's to 150's/110's-120's. ? ?States he thinks his elevated BP's are from Eligard injections  ?Elevated BP in office and elevated BP readings at home ?Admits that he is overweight and does drink - 3 to 4 standard drinks per night ?Reports he is exercising at low intensity levels  ? ?Reports he is discussing dc of Eligard with his Urologist to assist with elevated BP ?States he is walking 3 miles per day roughly 20 minute mile pace ? ? ? ? ?Medications: ?Outpatient Medications Prior to Visit  ?Medication Sig  ? finasteride (PROSCAR) 5 MG tablet Take 5 mg by mouth every morning.   ? fish oil-omega-3 fatty acids 1000 MG capsule 1,000 mg.  ? KRILL OIL PO Take by mouth. 800 mg softgels  ? leuprolide, 6 Month, (LEUPROLIDE ACETATE, 6 MONTH,) 45 MG injection   ?  Misc Natural Products (GLUCOSAMINE CHONDROITIN COMPLX) TABS Take 1 tablet by mouth daily.   ? Multiple Vitamins-Minerals (ONE-A-DAY MENS 50+ PO) Take by mouth.  ? NON FORMULARY Juice Plus fruit Blend Supplement  ? NON FORMULARY Juice Plus Vegetable Blend  ? omeprazole (PRILOSEC) 20 MG capsule Take 1 capsule (20 mg total) by mouth daily.  ? vitamin E 180 MG (400 UNITS) capsule 360 mg.  ? [DISCONTINUED] meloxicam (MOBIC) 7.5 MG tablet Take 7.5 mg by mouth daily.  ? [DISCONTINUED] Omega-3 Fatty Acids (FISH OIL BURP-LESS) 500 MG CAPS Take 1 capsule by mouth daily.   ? [DISCONTINUED] Propylene Glycol 0.6 % SOLN Place 1 drop into both eyes 3 (three) times daily as needed (dry eyes).  ? ?No facility-administered medications prior to visit.  ? ? ?Review of Systems  ?Constitutional:  Negative for chills, diaphoresis and fatigue.  ?Respiratory:  Negative for cough and shortness of breath.   ?Cardiovascular:  Negative for chest pain, palpitations and leg swelling.  ?Neurological:  Negative for dizziness, weakness, light-headedness and headaches.  ? ? ?  Objective  ?  ?BP (!) 130/100 (BP Location: Right Arm, Patient Position: Sitting, Cuff Size: Large)   Pulse 80   Temp (!) 97.2 ?F (36.2 ?C) (Temporal)   Resp 16   Ht 6' (1.829 m)   Wt 238 lb 9.6 oz (108.2 kg)   SpO2  97%   BMI 32.36 kg/m?  ? ? ?Physical Exam ?Vitals reviewed.  ?Constitutional:   ?   General: He is awake.  ?   Appearance: Normal appearance. He is well-developed and well-groomed. He is obese.  ?HENT:  ?   Head: Normocephalic and atraumatic.  ?Eyes:  ?   Extraocular Movements: Extraocular movements intact.  ?   Conjunctiva/sclera: Conjunctivae normal.  ?Pulmonary:  ?   Effort: Pulmonary effort is normal.  ?Neurological:  ?   General: No focal deficit present.  ?   Mental Status: He is alert and oriented to person, place, and time.  ?Psychiatric:     ?   Mood and Affect: Mood normal.     ?   Behavior: Behavior normal. Behavior is cooperative.     ?   Thought  Content: Thought content normal.     ?   Judgment: Judgment normal.  ?  ? ? ?No results found for any visits on 01/25/22. ? Assessment & Plan  ?  ? ?Problem List Items Addressed This Visit   ?None ?Visit Diagnoses   ? ? Hypertension, unspecified type    -  Primary ?Newly developed  ?Currently unmanaged  ?Potentially caused by current Eligard treatment plus natural aging and overall lifestyle ?Discussed this with patient and recommend he talk with Urology about Eligard ?Recommend he increased intensity of current physical activity, reduce alcohol intake and consume heart healthy diet  ?Will also provide HCTZ 12.5 mg PO QD to assist with BP management ?Follow up recommended in 4 weeks to check BP in office ? ?  ? Relevant Medications  ? hydrochlorothiazide (HYDRODIURIL) 12.5 MG tablet  ? ?  ? ? ? ?Return in about 4 weeks (around 02/22/2022) for recheck BP . ? ? ?I, Quadir Muns E Caidance Sybert, PA-C, have reviewed all documentation for this visit. The documentation on 01/25/22 for the exam, diagnosis, procedures, and orders are all accurate and complete. ? ? ?Argyle Gustafson, Glennie Isle MPH ?[place] ?[facility] ? ? ? ? ?No follow-ups on file.  ?  ? ?Aija Scarfo E Sonya Pucci, PA-C  ?[place] ?[phone] (phone) ?[phone] (fax) ? ?[facility]  ?

## 2022-01-25 NOTE — Patient Instructions (Signed)
Increase the pace of walking - try to hit a 15- 18 minute mile pace to increase your intensity ?Please continue to walk as frequently as you already are ?Try to reduce your alcohol intake to 14 standard drinks per week ? ?We can add HCTZ 12.5 mg by mouth once per day to assist with further BP reduction ?Please follow up in 4 weeks to re-evaluate your BP in office ? ? ?

## 2022-02-12 ENCOUNTER — Encounter: Admitting: Family Medicine

## 2022-02-15 NOTE — Progress Notes (Signed)
?  ? ? ?Established patient visit ? ?I,April Miller,acting as a scribe for Wilhemena Durie, MD.,have documented all relevant documentation on the behalf of Wilhemena Durie, MD,as directed by  Wilhemena Durie, MD while in the presence of Wilhemena Durie, MD. ? ? ?Patient: Frank Peterson   DOB: 07/04/1961   61 y.o. Male  MRN: [id number] ?Visit Date: 02/18/2022 ? ?Today's healthcare provider: Wilhemena Durie, MD  ? ?Chief Complaint  ?Patient presents with  ? Follow-up  ? Hypertension  ? ?Subjective  ?  ?HPI  ?I have not seen patient since 2018 but he is doing fairly well.  Retired Retail banker.  He dips tobacco daily.  He drinks 3-4 beers daily several times per week.  No history of alcohol problems.  He is followed by urology for prostate issues and prostate cancer. ?He has a history of hypertension hyperlipidemia and reflux.  He is on finasteride for his BPH and HCTZ low-dose for his hypertension OMeprazole for his reflux ?Hypertension, follow-up ? ?BP Readings from Last 3 Encounters:  ?02/18/22 116/82  ?01/25/22 (!) 130/100  ?01/07/22 (!) 159/114  ? Wt Readings from Last 3 Encounters:  ?02/18/22 236 lb (107 kg)  ?01/25/22 238 lb 9.6 oz (108.2 kg)  ?01/07/22 238 lb (108 kg)  ?  ? ?He was last seen for hypertension 3 months ago.  ?BP at that visit was 130/.  ?Management since that visit includes; Recommended he increased intensity of current physical activity, reduce alcohol intake and consume heart healthy diet. Will also provide HCTZ 12.5 mg PO QD to assist with BP management. ? ?He reports good compliance with treatment. ?He is not having side effects. none ?He is following a Regular diet. ?He is exercising. ?He does not smoke. ? ?Use of agents associated with hypertension: none.  ? ?Outside blood pressures are not checking. ? ?Pertinent labs ?Lab Results  ?Component Value Date  ? CHOL 192 05/26/2017  ? HDL 53 05/26/2017  ? LDLCALC 109 (H) 05/26/2017  ? TRIG 148 05/26/2017  ?  CHOLHDL 3.6 05/26/2017  ? Lab Results  ?Component Value Date  ? NA 142 05/26/2017  ? K 4.8 05/26/2017  ? CREATININE 1.00 01/12/2020  ? GFRNONAA 84 05/26/2017  ? GLUCOSE 91 05/26/2017  ? TSH 1.390 05/26/2017  ?  ? ?The 10-year ASCVD risk score (Arnett DK, et al., 2019) is: 10.7%* (Cholesterol units were assumed) ? ?--------------------------------------------------------------------------------------------------- ? ? ?Medications: ?Outpatient Medications Prior to Visit  ?Medication Sig  ? finasteride (PROSCAR) 5 MG tablet Take 5 mg by mouth every morning.   ? fish oil-omega-3 fatty acids 1000 MG capsule 1,000 mg.  ? hydrochlorothiazide (HYDRODIURIL) 12.5 MG tablet Take 1 tablet (12.5 mg total) by mouth daily.  ? KRILL OIL PO Take by mouth. 800 mg softgels  ? Misc Natural Products (GLUCOSAMINE CHONDROITIN COMPLX) TABS Take 1 tablet by mouth daily.   ? Multiple Vitamins-Minerals (ONE-A-DAY MENS 50+ PO) Take by mouth.  ? NON FORMULARY Juice Plus fruit Blend Supplement  ? NON FORMULARY Juice Plus Vegetable Blend  ? omeprazole (PRILOSEC) 20 MG capsule Take 1 capsule (20 mg total) by mouth daily.  ? vitamin E 180 MG (400 UNITS) capsule 360 mg.  ? leuprolide, 6 Month, (LEUPROLIDE ACETATE, 6 MONTH,) 45 MG injection  (Patient not taking: Reported on 02/18/2022)  ? ?No facility-administered medications prior to visit.  ? ? ?Review of Systems  ?Constitutional:  Negative for appetite change, chills and fever.  ?Respiratory:  Negative for  chest tightness, shortness of breath and wheezing.   ?Cardiovascular:  Negative for chest pain and palpitations.  ?Gastrointestinal:  Negative for abdominal pain, nausea and vomiting.  ? ?Last lipids ?Lab Results  ?Component Value Date  ? CHOL 192 05/26/2017  ? HDL 53 05/26/2017  ? LDLCALC 109 (H) 05/26/2017  ? TRIG 148 05/26/2017  ? CHOLHDL 3.6 05/26/2017  ? ?  ?  Objective  ?  ?BP 116/82 (BP Location: Right Arm, Patient Position: Sitting, Cuff Size: Large)   Pulse 87   Resp 16   Wt 236 lb  (107 kg)   SpO2 98%   BMI 32.01 kg/m?  ?BP Readings from Last 3 Encounters:  ?02/18/22 116/82  ?01/25/22 (!) 130/100  ?01/07/22 (!) 159/114  ? ?Wt Readings from Last 3 Encounters:  ?02/18/22 236 lb (107 kg)  ?01/25/22 238 lb 9.6 oz (108.2 kg)  ?01/07/22 238 lb (108 kg)  ? ?  ? ?Physical Exam ?Vitals reviewed.  ?Constitutional:   ?   General: He is awake.  ?   Appearance: Normal appearance. He is well-developed and well-groomed. He is obese.  ?HENT:  ?   Head: Normocephalic and atraumatic.  ?Eyes:  ?   Extraocular Movements: Extraocular movements intact.  ?   Conjunctiva/sclera: Conjunctivae normal.  ?Pulmonary:  ?   Effort: Pulmonary effort is normal.  ?Musculoskeletal:  ?   Right lower leg: No edema.  ?   Left lower leg: No edema.  ?Skin: ?   General: Skin is warm and dry.  ?Neurological:  ?   General: No focal deficit present.  ?   Mental Status: He is alert and oriented to person, place, and time.  ?Psychiatric:     ?   Mood and Affect: Mood normal.     ?   Behavior: Behavior normal. Behavior is cooperative.     ?   Thought Content: Thought content normal.     ?   Judgment: Judgment normal.  ?  ? ? ?No results found for any visits on 02/18/22. ? Assessment & Plan  ?  ? ?1. Hypertension, unspecified type ?Appears to be well controlled on HCTZ at 12.5 daily.  Check home blood pressure readings and follow-up in a few months for physical. ?More than 50% 25 minute visit spent in counseling or coordination of care ? ?2. Prostate cancer ([place]) ?Prostate cancer followed by Dr. Eliberto Ivory ?Patient has been treated with hormone therapy with Eligard ? ?3. Gastroesophageal reflux disease, unspecified whether esophagitis present ? ? ?4. Pure hypercholesterolemia ?DL was 152. ?5.  Tobacco abuse ?Exam is normal today but patient is encouraged to discontinue dipping ? ? ?Return in about 6 months (around 08/20/2022).  ?   ? ?I, Wilhemena Durie, MD, have reviewed all documentation for this visit. The documentation on 02/22/22 for  the exam, diagnosis, procedures, and orders are all accurate and complete. ? ? ? ?Nessie Nong Cranford Mon, MD  ?[facility] ?[phone] (phone) ?[phone] (fax) ? ?[facility] ?

## 2022-02-18 ENCOUNTER — Encounter: Payer: Self-pay | Admitting: Family Medicine

## 2022-02-18 ENCOUNTER — Ambulatory Visit (INDEPENDENT_AMBULATORY_CARE_PROVIDER_SITE_OTHER): Admitting: Family Medicine

## 2022-02-18 VITALS — BP 116/82 | HR 87 | Resp 16 | Wt 236.0 lb

## 2022-02-18 DIAGNOSIS — C61 Malignant neoplasm of prostate: Secondary | ICD-10-CM

## 2022-02-18 DIAGNOSIS — Z720 Tobacco use: Secondary | ICD-10-CM

## 2022-02-18 DIAGNOSIS — H524 Presbyopia: Secondary | ICD-10-CM | POA: Insufficient documentation

## 2022-02-18 DIAGNOSIS — E7800 Pure hypercholesterolemia, unspecified: Secondary | ICD-10-CM

## 2022-02-18 DIAGNOSIS — K219 Gastro-esophageal reflux disease without esophagitis: Secondary | ICD-10-CM

## 2022-02-18 DIAGNOSIS — I10 Essential (primary) hypertension: Secondary | ICD-10-CM

## 2022-02-18 DIAGNOSIS — Z789 Other specified health status: Secondary | ICD-10-CM | POA: Insufficient documentation

## 2022-02-18 NOTE — Patient Instructions (Addendum)
CHECK BLOOD PRESSURES AT HOME WEEKLY. BRING YOUR BLOOD PRESSURE CUFF TO NEXT OFFICE VISIT. STOP PRILOSEC. EXERCISE 4-6 DAYS PER WEEK. STOP TOBACCO.  ?

## 2022-02-20 ENCOUNTER — Encounter: Admitting: Family Medicine

## 2022-02-27 ENCOUNTER — Other Ambulatory Visit: Payer: Self-pay | Admitting: Physician Assistant

## 2022-02-27 DIAGNOSIS — I10 Essential (primary) hypertension: Secondary | ICD-10-CM

## 2022-02-27 NOTE — Telephone Encounter (Signed)
Requested medication (s) are due for refill today: expired medication ? ?Requested medication (s) are on the active medication list: yes ? ?Last refill:  01/25/22-02/24/22 #30 1 refill ? ?Future visit scheduled: yes 5 months ? ?Notes to clinic:  expired medication, last labs 01/12/20. Do you want to renew Rx? ? ? ?  ?Requested Prescriptions  ?Pending Prescriptions Disp Refills  ? hydrochlorothiazide (HYDRODIURIL) 12.5 MG tablet [Pharmacy Med Name: HYDROCHLOROTHIAZIDE 12.5 MG TAB] 30 tablet 1  ?  Sig: TAKE 1 TABLET BY MOUTH DAILY  ?  ? Cardiovascular: Diuretics - Thiazide Failed - 02/27/2022  9:22 AM  ?  ?  Failed - Cr in normal range and within 180 days  ?  Creatinine, Ser  ?Date Value Ref Range Status  ?01/12/2020 1.00 0.61 - 1.24 mg/dL Final  ?  ?  ?  ?  Failed - K in normal range and within 180 days  ?  Potassium  ?Date Value Ref Range Status  ?05/26/2017 4.8 3.5 - 5.2 mmol/L Final  ?  ?  ?  ?  Failed - Na in normal range and within 180 days  ?  Sodium  ?Date Value Ref Range Status  ?05/26/2017 142 134 - 144 mmol/L Final  ?  ?  ?  ?  Passed - Last BP in normal range  ?  BP Readings from Last 1 Encounters:  ?02/18/22 116/82  ?  ?  ?  ?  Passed - Valid encounter within last 6 months  ?  Recent Outpatient Visits   ? ?      ? 1 week ago Hypertension, unspecified type  ? [facility] Jerrol Banana., MD  ? 1 month ago Hypertension, unspecified type  ? [employer], Dani Gobble, PA-C  ? 4 years ago Annual physical exam  ? [facility] Jerrol Banana., MD  ? 5 years ago Annual physical exam  ? [facility] Jerrol Banana., MD  ? 5 years ago Shingles  ? [facility] Jerrol Banana., MD  ? ?  ?  ?Future Appointments   ? ?        ? In 5 months Jerrol Banana., MD [facility]  ? ?  ? ? ?  ?  ?  ? ?

## 2022-05-06 ENCOUNTER — Other Ambulatory Visit: Payer: Self-pay | Admitting: Family Medicine

## 2022-05-06 DIAGNOSIS — I10 Essential (primary) hypertension: Secondary | ICD-10-CM

## 2022-07-03 ENCOUNTER — Inpatient Hospital Stay: Attending: Radiation Oncology

## 2022-07-03 DIAGNOSIS — C61 Malignant neoplasm of prostate: Secondary | ICD-10-CM | POA: Insufficient documentation

## 2022-07-03 LAB — PSA: Prostatic Specific Antigen: 0.23 ng/mL (ref 0.00–4.00)

## 2022-07-10 ENCOUNTER — Ambulatory Visit: Payer: PRIVATE HEALTH INSURANCE | Admitting: Radiation Oncology

## 2022-07-18 ENCOUNTER — Ambulatory Visit
Admission: RE | Admit: 2022-07-18 | Discharge: 2022-07-18 | Disposition: A | Discharge status: Home / Self Care | Source: Ambulatory Visit | Attending: Radiation Oncology | Admitting: Radiation Oncology

## 2022-07-18 ENCOUNTER — Other Ambulatory Visit: Payer: Self-pay | Admitting: *Deleted

## 2022-07-18 VITALS — BP 134/80 | HR 87 | Temp 97.2°F | Resp 16 | Ht 72.0 in | Wt 239.7 lb

## 2022-07-18 DIAGNOSIS — C61 Malignant neoplasm of prostate: Secondary | ICD-10-CM | POA: Insufficient documentation

## 2022-07-18 DIAGNOSIS — Z923 Personal history of irradiation: Secondary | ICD-10-CM | POA: Diagnosis not present

## 2022-07-18 DIAGNOSIS — R351 Nocturia: Secondary | ICD-10-CM | POA: Insufficient documentation

## 2022-07-18 NOTE — Progress Notes (Signed)
Radiation Oncology Follow up Note  Name: Frank Peterson   Date:   07/18/2022 MRN:  [id number] DOB: 10/08/1961    This 61 y.o. 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  Seen today in routine follow-up he is still having nocturia x2-4.  Specifically denies any other significant lower urinary tract symptoms diarrhea or fatigue.  He is over 6 months off of Eligard and his hot flashes have diminished.  His most recent PSA is stable at 0.23 it was 0.196 months prior..  COMPLICATIONS OF TREATMENT: none  FOLLOW UP COMPLIANCE: keeps appointments   PHYSICAL EXAM:  BP 134/80   Pulse 87   Temp (!) 97.2 F (36.2 C)   Resp 16   Ht 6' (1.829 m)   Wt 239 lb 11.2 oz (108.7 kg)   BMI 32.51 kg/m  Well-developed well-nourished patient in NAD. HEENT reveals PERLA, EOMI, discs not visualized.  Oral cavity is clear. No oral mucosal lesions are identified. Neck is clear without evidence of cervical or supraclavicular adenopathy. Lungs are clear to A&P. Cardiac examination is essentially unremarkable with regular rate and rhythm without murmur rub or thrill. Abdomen is benign with no organomegaly or masses noted. Motor sensory and DTR levels are equal and symmetric in the upper and lower extremities. Cranial nerves II through XII are grossly intact. Proprioception is intact. No peripheral adenopathy or edema is identified. No motor or sensory levels are noted. Crude visual fields are within normal range.  RADIOLOGY RESULTS: No current films for review  PLAN: At the present time patient is under excellent biochemical  control of his prostate cancer.  Of asked to see him back in 6 months with a repeat PSA.  I have asked him to discuss some of his urinary symptoms with urology at his next meeting.  Patient is to call with any concerns.  I would like to take this opportunity to thank you for allowing me to participate in the care of your patient..Frank Filbert MD

## 2022-08-14 ENCOUNTER — Encounter: Admitting: Family Medicine

## 2022-09-11 ENCOUNTER — Telehealth: Payer: Self-pay | Admitting: Family Medicine

## 2022-09-11 DIAGNOSIS — I10 Essential (primary) hypertension: Secondary | ICD-10-CM

## 2022-09-11 NOTE — Telephone Encounter (Signed)
[facility] faxed refill request for the following medications:  hydrochlorothiazide (HYDRODIURIL) 12.5 MG tablet   Please advise.

## 2022-09-12 MED ORDER — HYDROCHLOROTHIAZIDE 12.5 MG PO TABS
12.5000 mg | ORAL_TABLET | Freq: Every day | ORAL | 1 refills | Status: AC
Start: 2022-09-12 — End: ?

## 2023-01-20 ENCOUNTER — Inpatient Hospital Stay: Payer: PRIVATE HEALTH INSURANCE | Attending: Radiation Oncology

## 2023-01-27 ENCOUNTER — Ambulatory Visit: Payer: PRIVATE HEALTH INSURANCE | Admitting: Radiation Oncology

## 2023-02-12 ENCOUNTER — Inpatient Hospital Stay: Attending: Radiation Oncology

## 2023-02-12 DIAGNOSIS — Z923 Personal history of irradiation: Secondary | ICD-10-CM | POA: Insufficient documentation

## 2023-02-12 DIAGNOSIS — C61 Malignant neoplasm of prostate: Secondary | ICD-10-CM | POA: Insufficient documentation

## 2023-02-12 LAB — PSA: Prostatic Specific Antigen: 0.08 ng/mL (ref 0.00–4.00)

## 2023-02-19 ENCOUNTER — Ambulatory Visit
Admission: RE | Admit: 2023-02-19 | Discharge: 2023-02-19 | Disposition: A | Discharge status: Home / Self Care | Source: Ambulatory Visit | Attending: Radiation Oncology | Admitting: Radiation Oncology

## 2023-02-19 ENCOUNTER — Encounter: Payer: Self-pay | Admitting: Radiation Oncology

## 2023-02-19 VITALS — BP 139/99 | HR 65 | Temp 98.6°F | Resp 16 | Wt 235.0 lb

## 2023-02-19 DIAGNOSIS — Z923 Personal history of irradiation: Secondary | ICD-10-CM | POA: Diagnosis not present

## 2023-02-19 DIAGNOSIS — C61 Malignant neoplasm of prostate: Secondary | ICD-10-CM | POA: Insufficient documentation

## 2023-02-19 NOTE — Progress Notes (Signed)
Radiation Oncology Follow up Note  Name: Frank Peterson   Date:   02/19/2023 MRN:  [id number] DOB: 25-May-1961    This 62 y.o. male presents to the clinic today for 2-year follow-up status post salvage radiation therapy in a patient had undergone HIFU x 2 for Gleason 6 adenocarcinoma the prostate with biochemical progression.  REFERRING PROVIDER: Bosie Clos, MD  HPI: Patient is a 62 year old male now out 22 months having completed salvage radiation therapy to his prostatic fossa in a patient undergoing HIFU x 2 for Gleason 6 adenocarcinoma.  Seen today in routine follow-up he is doing well specifically denies any increased lower urinary tract symptoms diarrhea or fatigue.  Adenocarcinoma the prostate.  His PSA continues to decline is now 0.08 was 0.237 months ago.  COMPLICATIONS OF TREATMENT: none  FOLLOW UP COMPLIANCE: keeps appointments   PHYSICAL EXAM:  BP (!) 139/99 Comment: patient under the care of his PCP  Pulse 65   Temp 98.6 F (37 C) (Tympanic)   Resp 16   Wt 235 lb (106.6 kg)   BMI 31.87 kg/m  Well-developed well-nourished patient in NAD. HEENT reveals PERLA, EOMI, discs not visualized.  Oral cavity is clear. No oral mucosal lesions are identified. Neck is clear without evidence of cervical or supraclavicular adenopathy. Lungs are clear to A&P. Cardiac examination is essentially unremarkable with regular rate and rhythm without murmur rub or thrill. Abdomen is benign with no organomegaly or masses noted. Motor sensory and DTR levels are equal and symmetric in the upper and lower extremities. Cranial nerves II through XII are grossly intact. Proprioception is intact. No peripheral adenopathy or edema is identified. No motor or sensory levels are noted. Crude visual fields are within normal range.  RADIOLOGY RESULTS: No current films for review  PLAN: Present time patient is under excellent biochemical control of his prostate cancer.  At this time I am going to  turn follow-up care over his to his PMD and of asked him to draw a PSA once a year.  Patient is to call me at any time with any concerns.  I would like to take this opportunity to thank you for allowing me to participate in the care of your patient.Carmina Miller, MD

## 2023-06-21 ENCOUNTER — Other Ambulatory Visit: Payer: Self-pay | Admitting: Family Medicine

## 2023-06-21 DIAGNOSIS — I10 Essential (primary) hypertension: Secondary | ICD-10-CM

## 2023-06-23 NOTE — Telephone Encounter (Signed)
Requested medications are due for refill today.  yes  Requested medications are on the active medications list.  yes  Last refill. 09/12/2022 #90 1 rf  Future visit scheduled.   no  Notes to clinic.  Dr. Sullivan Lone pt - pt last seen over 1 year ago.    Requested Prescriptions  Pending Prescriptions Disp Refills   hydrochlorothiazide (HYDRODIURIL) 12.5 MG tablet [Pharmacy Med Name: HYDROCHLOROTHIAZIDE 12.5 MG TAB] 90 tablet 1    Sig: TAKE 1 TABLET BY MOUTH ONCE DAILY     Cardiovascular: Diuretics - Thiazide Failed - 06/21/2023 11:57 AM      Failed - Cr in normal range and within 180 days    Creatinine, Ser  Date Value Ref Range Status  01/12/2020 1.00 0.61 - 1.24 mg/dL Final         Failed - K in normal range and within 180 days    Potassium  Date Value Ref Range Status  05/26/2017 4.8 3.5 - 5.2 mmol/L Final         Failed - Na in normal range and within 180 days    Sodium  Date Value Ref Range Status  05/26/2017 142 134 - 144 mmol/L Final         Failed - Last BP in normal range    BP Readings from Last 1 Encounters:  02/19/23 (!) 139/99         Failed - Valid encounter within last 6 months    Recent Outpatient Visits           1 year ago Hypertension, unspecified type   [facility] Bosie Clos, MD   1 year ago Hypertension, unspecified type   [place] [facility] Mecum, Oswaldo Conroy, PA-C   6 years ago Annual physical exam   [facility] Bosie Clos, MD   7 years ago Annual physical exam   [facility] Bosie Clos, MD   7 years ago Shingles   [facility] Bosie Clos, MD

## 2024-05-03 ENCOUNTER — Ambulatory Visit: Payer: Self-pay

## 2024-05-03 DIAGNOSIS — K640 First degree hemorrhoids: Secondary | ICD-10-CM | POA: Diagnosis not present

## 2024-05-03 DIAGNOSIS — Z1211 Encounter for screening for malignant neoplasm of colon: Secondary | ICD-10-CM | POA: Diagnosis present
